# Patient Record
Sex: Female | Born: 1940 | Race: White | Hispanic: No | Marital: Married | Smoking: Never smoker
Health system: Southern US, Community
[De-identification: ages and names within clinical notes are randomized; demographics above are authoritative.]

## PROBLEM LIST (undated history)

## (undated) DIAGNOSIS — E119 Type 2 diabetes mellitus without complications: Secondary | ICD-10-CM

## (undated) DIAGNOSIS — H409 Unspecified glaucoma: Secondary | ICD-10-CM

## (undated) DIAGNOSIS — E785 Hyperlipidemia, unspecified: Secondary | ICD-10-CM

## (undated) DIAGNOSIS — I499 Cardiac arrhythmia, unspecified: Secondary | ICD-10-CM

## (undated) DIAGNOSIS — E039 Hypothyroidism, unspecified: Secondary | ICD-10-CM

## (undated) DIAGNOSIS — M1990 Unspecified osteoarthritis, unspecified site: Secondary | ICD-10-CM

## (undated) HISTORY — DX: Unspecified glaucoma: H40.9

## (undated) HISTORY — PX: HERNIA REPAIR: SHX51

## (undated) HISTORY — PX: CHOLECYSTECTOMY: SHX55

## (undated) HISTORY — PX: GALLBLADDER SURGERY: SHX652

---

## 2009-12-25 ENCOUNTER — Emergency Department: Payer: Self-pay | Admitting: Emergency Medicine

## 2010-01-04 ENCOUNTER — Ambulatory Visit: Payer: Self-pay | Admitting: General Surgery

## 2016-01-05 ENCOUNTER — Encounter: Payer: Self-pay | Admitting: *Deleted

## 2016-01-05 ENCOUNTER — Emergency Department: Payer: Medicare Other

## 2016-01-05 ENCOUNTER — Inpatient Hospital Stay
Admission: EM | Admit: 2016-01-05 | Discharge: 2016-01-07 | DRG: 310 | Disposition: A | Discharge status: Home / Self Care | Payer: Medicare Other | Attending: Internal Medicine | Admitting: Internal Medicine

## 2016-01-05 DIAGNOSIS — Z7984 Long term (current) use of oral hypoglycemic drugs: Secondary | ICD-10-CM | POA: Diagnosis not present

## 2016-01-05 DIAGNOSIS — E039 Hypothyroidism, unspecified: Secondary | ICD-10-CM | POA: Diagnosis present

## 2016-01-05 DIAGNOSIS — E119 Type 2 diabetes mellitus without complications: Secondary | ICD-10-CM | POA: Diagnosis present

## 2016-01-05 DIAGNOSIS — Z886 Allergy status to analgesic agent status: Secondary | ICD-10-CM

## 2016-01-05 DIAGNOSIS — M542 Cervicalgia: Secondary | ICD-10-CM | POA: Diagnosis present

## 2016-01-05 DIAGNOSIS — I4891 Unspecified atrial fibrillation: Principal | ICD-10-CM | POA: Diagnosis present

## 2016-01-05 DIAGNOSIS — E785 Hyperlipidemia, unspecified: Secondary | ICD-10-CM | POA: Diagnosis present

## 2016-01-05 DIAGNOSIS — Z79899 Other long term (current) drug therapy: Secondary | ICD-10-CM

## 2016-01-05 DIAGNOSIS — I10 Essential (primary) hypertension: Secondary | ICD-10-CM | POA: Diagnosis present

## 2016-01-05 HISTORY — DX: Hyperlipidemia, unspecified: E78.5

## 2016-01-05 HISTORY — DX: Hypothyroidism, unspecified: E03.9

## 2016-01-05 HISTORY — DX: Type 2 diabetes mellitus without complications: E11.9

## 2016-01-05 LAB — BASIC METABOLIC PANEL
Anion gap: 9 (ref 5–15)
BUN: 23 mg/dL — AB (ref 6–20)
CHLORIDE: 102 mmol/L (ref 101–111)
CO2: 25 mmol/L (ref 22–32)
CREATININE: 0.88 mg/dL (ref 0.44–1.00)
Calcium: 8.9 mg/dL (ref 8.9–10.3)
GFR calc Af Amer: >60 mL/min (ref >60)
GFR calc non Af Amer: >60 mL/min (ref >60)
Glucose, Bld: 295 mg/dL — ABNORMAL HIGH (ref 65–99)
Potassium: 4.3 mmol/L (ref 3.5–5.1)
SODIUM: 136 mmol/L (ref 135–145)

## 2016-01-05 LAB — CBC WITH DIFFERENTIAL/PLATELET
Basophils Absolute: 0.1 10*3/uL (ref 0–0.1)
Basophils Relative: 1 %
EOS PCT: 1 %
Eosinophils Absolute: 0.1 10*3/uL (ref 0–0.7)
HCT: 40.1 % (ref 35.0–47.0)
HEMOGLOBIN: 13.4 g/dL (ref 12.0–16.0)
LYMPHS ABS: 1.2 10*3/uL (ref 1.0–3.6)
LYMPHS PCT: 17 %
MCH: 31 pg (ref 26.0–34.0)
MCHC: 33.5 g/dL (ref 32.0–36.0)
MCV: 92.5 fL (ref 80.0–100.0)
Monocytes Absolute: 0.7 10*3/uL (ref 0.2–0.9)
Monocytes Relative: 10 %
NEUTROS PCT: 71 %
Neutro Abs: 4.9 10*3/uL (ref 1.4–6.5)
Platelets: 208 10*3/uL (ref 150–440)
RBC: 4.33 MIL/uL (ref 3.80–5.20)
RDW: 14.5 % (ref 11.5–14.5)
WBC: 7 10*3/uL (ref 3.6–11.0)

## 2016-01-05 LAB — PROTIME-INR
INR: 1.19
Prothrombin Time: 15.3 seconds — ABNORMAL HIGH (ref 11.4–15.0)

## 2016-01-05 LAB — TSH: TSH: 3.142 u[IU]/mL (ref 0.350–4.500)

## 2016-01-05 LAB — TROPONIN I: TROPONIN I: 0.03 ng/mL (ref <0.031)

## 2016-01-05 LAB — MRSA PCR SCREENING: MRSA by PCR: NEGATIVE

## 2016-01-05 LAB — GLUCOSE, CAPILLARY: Glucose-Capillary: 212 mg/dL — ABNORMAL HIGH (ref 65–99)

## 2016-01-05 LAB — BRAIN NATRIURETIC PEPTIDE: B Natriuretic Peptide: 422 pg/mL — ABNORMAL HIGH (ref 0.0–100.0)

## 2016-01-05 LAB — MAGNESIUM: Magnesium: 1.7 mg/dL (ref 1.7–2.4)

## 2016-01-05 MED ORDER — ONDANSETRON HCL 4 MG PO TABS: 4.0000 mg | ORAL_TABLET | Freq: Four times a day (QID) | ORAL | Status: DC | PRN

## 2016-01-05 MED ORDER — ENOXAPARIN SODIUM 120 MG/0.8ML ~~LOC~~ SOLN
1.0000 mg/kg | Freq: Two times a day (BID) | SUBCUTANEOUS | Status: DC
  Administered 2016-01-05 – 2016-01-06 (×3): 115 mg via SUBCUTANEOUS
  Filled 2016-01-05 (×5): qty 0.8

## 2016-01-05 MED ORDER — TIMOLOL MALEATE 0.5 % OP SOLN
1.0000 [drp] | Freq: Every day | OPHTHALMIC | Status: DC
  Administered 2016-01-06 – 2016-01-07 (×2): 1 [drp] via OPHTHALMIC
  Filled 2016-01-05: qty 5

## 2016-01-05 MED ORDER — ACETAMINOPHEN 325 MG PO TABS
650.0000 mg | ORAL_TABLET | Freq: Four times a day (QID) | ORAL | Status: DC | PRN
  Administered 2016-01-06: 650 mg via ORAL
  Filled 2016-01-05: qty 2

## 2016-01-05 MED ORDER — LEVOTHYROXINE SODIUM 50 MCG PO TABS
75.0000 ug | ORAL_TABLET | Freq: Every day | ORAL | Status: DC
  Administered 2016-01-06 – 2016-01-07 (×2): 75 ug via ORAL
  Filled 2016-01-05 (×2): qty 1

## 2016-01-05 MED ORDER — INSULIN ASPART 100 UNIT/ML ~~LOC~~ SOLN
0.0000 [IU] | Freq: Three times a day (TID) | SUBCUTANEOUS | Status: DC
Start: 2016-01-06 — End: 2016-01-07
  Administered 2016-01-06: 3 [IU] via SUBCUTANEOUS
  Administered 2016-01-06: 5 [IU] via SUBCUTANEOUS
  Administered 2016-01-06: 2 [IU] via SUBCUTANEOUS
  Administered 2016-01-07: 3 [IU] via SUBCUTANEOUS
  Administered 2016-01-07: 2 [IU] via SUBCUTANEOUS
  Filled 2016-01-05 (×2): qty 2
  Filled 2016-01-05 (×2): qty 3
  Filled 2016-01-05: qty 5

## 2016-01-05 MED ORDER — INSULIN ASPART 100 UNIT/ML ~~LOC~~ SOLN
0.0000 [IU] | Freq: Every day | SUBCUTANEOUS | Status: DC
Start: 2016-01-05 — End: 2016-01-07

## 2016-01-05 MED ORDER — SODIUM CHLORIDE 0.9 % IV SOLN
INTRAVENOUS | Status: DC
  Administered 2016-01-05: 23:00:00 via INTRAVENOUS

## 2016-01-05 MED ORDER — PIOGLITAZONE HCL 30 MG PO TABS
15.0000 mg | ORAL_TABLET | Freq: Every day | ORAL | Status: DC
  Administered 2016-01-06 – 2016-01-07 (×2): 15 mg via ORAL
  Filled 2016-01-05 (×3): qty 1

## 2016-01-05 MED ORDER — ASPIRIN 81 MG PO CHEW
324.0000 mg | CHEWABLE_TABLET | Freq: Once | ORAL | Status: AC
  Administered 2016-01-05: 324 mg via ORAL
  Filled 2016-01-05: qty 4

## 2016-01-05 MED ORDER — GLIMEPIRIDE 2 MG PO TABS
1.0000 mg | ORAL_TABLET | Freq: Every evening | ORAL | Status: DC
  Administered 2016-01-06: 1 mg via ORAL
  Filled 2016-01-05 (×2): qty 1

## 2016-01-05 MED ORDER — ACETAMINOPHEN 650 MG RE SUPP: 650.0000 mg | Freq: Four times a day (QID) | RECTAL | Status: DC | PRN

## 2016-01-05 MED ORDER — FENOFIBRATE 160 MG PO TABS
160.0000 mg | ORAL_TABLET | Freq: Every day | ORAL | Status: DC
  Administered 2016-01-05 – 2016-01-06 (×2): 160 mg via ORAL
  Filled 2016-01-05 (×3): qty 1

## 2016-01-05 MED ORDER — METFORMIN HCL 500 MG PO TABS
500.0000 mg | ORAL_TABLET | Freq: Two times a day (BID) | ORAL | Status: DC
  Administered 2016-01-06 – 2016-01-07 (×3): 500 mg via ORAL
  Filled 2016-01-05 (×3): qty 1

## 2016-01-05 MED ORDER — SODIUM CHLORIDE 0.9 % IV BOLUS (SEPSIS)
500.0000 mL | Freq: Once | INTRAVENOUS | Status: AC
  Administered 2016-01-05: 500 mL via INTRAVENOUS

## 2016-01-05 MED ORDER — DEXTROSE 5 % IV SOLN
5.0000 mg/h | Freq: Once | INTRAVENOUS | Status: AC
  Administered 2016-01-05: 5 mg/h via INTRAVENOUS
  Filled 2016-01-05: qty 100

## 2016-01-05 MED ORDER — TRIAMCINOLONE ACETONIDE 0.1 % EX CREA
1.0000 "application " | TOPICAL_CREAM | Freq: Two times a day (BID) | CUTANEOUS | Status: DC
  Administered 2016-01-05 – 2016-01-06 (×3): 1 via TOPICAL
  Filled 2016-01-05: qty 15

## 2016-01-05 MED ORDER — METOPROLOL TARTRATE 25 MG PO TABS
25.0000 mg | ORAL_TABLET | Freq: Four times a day (QID) | ORAL | Status: DC
  Administered 2016-01-05 – 2016-01-06 (×2): 25 mg via ORAL
  Filled 2016-01-05 (×2): qty 1

## 2016-01-05 MED ORDER — DILTIAZEM HCL 100 MG IV SOLR
5.0000 mg/h | INTRAVENOUS | Status: DC
  Administered 2016-01-06 (×2): 15 mg/h via INTRAVENOUS
  Administered 2016-01-06: 10 mg/h via INTRAVENOUS
  Administered 2016-01-07 (×2): 15 mg/h via INTRAVENOUS
  Administered 2016-01-07: 12.5 mg/h via INTRAVENOUS
  Filled 2016-01-05 (×5): qty 100

## 2016-01-05 MED ORDER — DILTIAZEM HCL 25 MG/5ML IV SOLN
20.0000 mg | Freq: Once | INTRAVENOUS | Status: AC
  Administered 2016-01-05: 20 mg via INTRAVENOUS
  Filled 2016-01-05: qty 5

## 2016-01-05 MED ORDER — ONDANSETRON HCL 4 MG/2ML IJ SOLN
4.0000 mg | Freq: Four times a day (QID) | INTRAMUSCULAR | Status: DC | PRN
  Administered 2016-01-07: 4 mg via INTRAVENOUS
  Filled 2016-01-05: qty 2

## 2016-01-05 MED ORDER — LATANOPROST 0.005 % OP SOLN
1.0000 [drp] | Freq: Every day | OPHTHALMIC | Status: DC
Start: 2016-01-05 — End: 2016-01-07
  Administered 2016-01-05 – 2016-01-06 (×2): 1 [drp] via OPHTHALMIC
  Filled 2016-01-05: qty 2.5

## 2016-01-05 MED ORDER — TRAMADOL HCL 50 MG PO TABS
75.0000 mg | ORAL_TABLET | Freq: Every day | ORAL | Status: DC
  Filled 2016-01-05: qty 2

## 2016-01-05 NOTE — ED Notes (Signed)
Patient transported to X-ray 

## 2016-01-05 NOTE — ED Provider Notes (Addendum)
[facility] Emergency Department Provider Note  ____________________________________________   I have reviewed the triage vital signs and the nursing notes.   HISTORY  Chief Complaint Weakness and Atrial Fibrillation    HPI Cynthia Friedman is a 75 y.o. female with a history of thyroid issues, as well as diabetes mellitus states over the last "few weeks" she has been feeling generally weak. Denies shortness of breath initially but now states she gets winded just walking up and down stairs. She has had no chest pain although she did have a little bit of pain in the right trapezius muscle now again. She cannot further describe it very well she states she cannot definitively describe it as  exertional and she is not having that now. She states that because she is feeling generally weak and she was getting too tired to even do her activities of daily living she would see her primary care doctor who noted that she was in A. fib with RVR and PCP and sent her here. She is not having any chest pain at this time.  Past Medical History  Diagnosis Date  . Diabetes mellitus without complication (HCC)   . Thyroid disease     There are no active problems to display for this patient.   Past Surgical History  Procedure Laterality Date  . Hernia repair    . Cholecystectomy      No current outpatient prescriptions on file.  Allergies Review of patient's allergies indicates no known allergies.  History reviewed. No pertinent family history.  Social History Social History  Substance Use Topics  . Smoking status: Never Smoker   . Smokeless tobacco: None  . Alcohol Use: No    Review of Systems \\Constitutional : No fever/chills Eyes: No visual changes. ENT: No sore throat. No stiff neck no neck pain Cardiovascular: Denies chest pain. Respiratory: Positive shortness of breath. Gastrointestinal:   no vomiting.  No diarrhea.  No constipation. Genitourinary: Negative  for dysuria. Musculoskeletal: Negative lower extremity swelling Skin: Negative for rash. Neurological: Negative for headaches, focal weakness or numbness. 10-point ROS otherwise negative.  ____________________________________________   PHYSICAL EXAM:  VITAL SIGNS: ED Triage Vitals  Enc Vitals Group     BP 01/05/16 1647 151/90 mmHg     Pulse Rate 01/05/16 1647 136     Resp 01/05/16 1647 20     Temp 01/05/16 1647 98.1 F (36.7 C)     Temp Source 01/05/16 1647 Oral     SpO2 01/05/16 1647 96 %     Weight 01/05/16 1647 252 lb (114.306 kg)     Height 01/05/16 1647 5' 7.5" (1.715 m)     Head Cir --      Peak Flow --      Pain Score --      Pain Loc --      Pain Edu? --      Excl. in GC? --     Constitutional: Alert and oriented. Well appearing and in no acute distress. Eyes: Conjunctivae are normal. PERRL. EOMI. Head: Atraumatic. Nose: No congestion/rhinnorhea. Mouth/Throat: Mucous membranes are moist.  Oropharynx non-erythematous. Neck: No stridor.   Nontender with no meningismus Cardiovascular: Tachycardia To 180, irregularly irregular rhythm. Grossly normal heart sounds.  Good peripheral circulation. Respiratory: Normal respiratory effort.  No retractions. Lungs CTAB. Abdominal: Soft and nontender. No distention. No guarding no rebound Back:  There is no focal tenderness or step off there is no midline tenderness there are no lesions noted. there  is no CVA tenderness Musculoskeletal: No lower extremity tenderness. No joint effusions, no DVT signs strong distal pulses no edema Neurologic:  Normal speech and language. No gross focal neurologic deficits are appreciated.  Skin:  Skin is warm, dry and intact. No rash noted. Psychiatric: Mood and affect are normal. Speech and behavior are normal.  ____________________________________________   LABS (all labs ordered are listed, but only abnormal results are displayed)  Labs Reviewed  BASIC METABOLIC PANEL  TROPONIN I   PROTIME-INR  TSH  CBC WITH DIFFERENTIAL/PLATELET  BRAIN NATRIURETIC PEPTIDE   ____________________________________________  EKG  I personally interpreted any EKGs ordered by me or triage Atrial fibrillation with rapid ventricular response rate 159 no acute ST elevation or ST depression, borderline LAD. ____________________________________________  RADIOLOGY  I reviewed any imaging ordered by me or triage that were performed during my shift and, if possible, patient and/or family made aware of any abnormal findings. ____________________________________________   PROCEDURES  Procedure(s) performed: None  Critical Care performed: CRITICAL CARE Performed by: Jeanmarie PlantJAMES A MCSHANE   Total critical care time: 40 minutes  Critical care time was exclusive of separately billable procedures and treating other patients.  Critical care was necessary to treat or prevent imminent or life-threatening deterioration.  Critical care was time spent personally by me on the following activities: development of treatment plan with patient and/or surrogate as well as nursing, discussions with consultants, evaluation of patient's response to treatment, examination of patient, obtaining history from patient or surrogate, ordering and performing treatments and interventions, ordering and review of laboratory studies, ordering and review of radiographic studies, pulse oximetry and re-evaluation of patient's condition.   ____________________________________________   INITIAL IMPRESSION / ASSESSMENT AND PLAN / ED COURSE  Pertinent labs & imaging results that were available during my care of the patient were reviewed by me and considered in my medical decision making (see chart for details).  Patient with A. fib with RVR, symptomatic, we will check her thyroid and cardiac enzymes. Obtain a chest x-ray. We will give her Cardizem and bring her heart rate down. We will further assess her as  needed.  ----------------------------------------- 7:10 PM on 01/05/2016 -----------------------------------------  On Cardizem drip her heart rate is coming down pressures are staying stable, TSH is reassuring, admitting to the hospitalist service they agree with management. ____________________________________________   FINAL CLINICAL IMPRESSION(S) / ED DIAGNOSES  Final diagnoses:  None      This chart was dictated using voice recognition software.  Despite best efforts to proofread,  errors can occur which can change meaning.     Jeanmarie PlantJames A McShane, MD 01/05/16 1723  Jeanmarie PlantJames A McShane, MD 01/05/16 1910

## 2016-01-05 NOTE — H&P (Signed)
Cynthia Friedman   PATIENT NAME: Cynthia Friedman    MR#:  [id number]  DATE OF BIRTH:  1941/03/22  DATE OF ADMISSION:  01/05/2016  PRIMARY CARE PHYSICIAN: Jaclyn Shaggy, MD   REQUESTING/REFERRING PHYSICIAN: Dr. Ileana Roup  CHIEF COMPLAINT:   Chief Complaint  Patient presents with  . Weakness  . Atrial Fibrillation    HISTORY OF PRESENT ILLNESS:  Cynthia Friedman  is a 75 y.o. female with a known history of diabetes mellitus, hypothyroidism and hyperlipidemia presents to the hospital due to palpitations and also extreme weakness and shortness of breath going on for 4 days now. Patient does not have any prior cardiac history. No prior diagnosis of arrhythmias or atrial fibrillation. She has been feeling extremely weak and also shortness of breath easily with minimal exertion for the last 2 weeks. For the last 4 days she started feeling fluttering in her heart went to see her PCP today. She was noted to be in A. fib with RVR with heart rate in 180s and was sent over to the emergency room. Heart rate is down to 120s while on Cardizem drip. Her symptoms are improving at this time.  PAST MEDICAL HISTORY:   Past Medical History  Diagnosis Date  . Diabetes mellitus without complication (HCC)   . Hypothyroidism   . Hyperlipidemia     PAST SURGICAL HISTORY:   Past Surgical History  Procedure Laterality Date  . Hernia repair    . Cholecystectomy      SOCIAL HISTORY:   Social History  Substance Use Topics  . Smoking status: Never Smoker   . Smokeless tobacco: Not on file  . Alcohol Use: No    FAMILY HISTORY:   Family History  Problem Relation Age of Onset  . Cancer Mother     Unknown cancer    DRUG ALLERGIES:   Allergies  Allergen Reactions  . Aspirin Other (See Comments)    Reaction:  Ringing in the ears    REVIEW OF SYSTEMS:   Review of Systems  Constitutional: Negative for fever, chills, weight loss and  malaise/fatigue.  HENT: Negative for ear discharge, ear pain, hearing loss, nosebleeds and tinnitus.   Eyes: Negative for blurred vision, double vision and photophobia.  Respiratory: Positive for shortness of breath. Negative for cough, hemoptysis and wheezing.   Cardiovascular: Positive for palpitations. Negative for chest pain, orthopnea and leg swelling.  Gastrointestinal: Negative for heartburn, nausea, vomiting, abdominal pain, diarrhea, constipation and melena.  Genitourinary: Negative for dysuria, urgency, frequency and hematuria.  Musculoskeletal: Negative for myalgias, back pain and neck pain.  Skin: Negative for rash.  Neurological: Positive for weakness. Negative for dizziness, tingling, tremors, sensory change, speech change, focal weakness and headaches.  Endo/Heme/Allergies: Does not bruise/bleed easily.  Psychiatric/Behavioral: Negative for depression.    MEDICATIONS AT HOME:   Prior to Admission medications   Medication Sig Start Date End Date Taking? Authorizing Provider  clobetasol ointment (TEMOVATE) 0.05 % Apply 1 application topically 2 (two) times daily.   Yes Historical Provider, MD  fenofibrate (TRICOR) 145 MG tablet Take 145 mg by mouth at bedtime.   Yes Historical Provider, MD  glimepiride (AMARYL) 1 MG tablet Take 1 mg by mouth every evening.   Yes Historical Provider, MD  latanoprost (XALATAN) 0.005 % ophthalmic solution Place 1 drop into both eyes at bedtime.   Yes Historical Provider, MD  levothyroxine (SYNTHROID, LEVOTHROID) 75 MCG tablet Take 75 mcg by mouth daily before breakfast.  Yes Historical Provider, MD  metFORMIN (GLUCOPHAGE) 500 MG tablet Take 500 mg by mouth 2 (two) times daily with a meal.   Yes Historical Provider, MD  pioglitazone (ACTOS) 15 MG tablet Take 15 mg by mouth daily.   Yes Historical Provider, MD  timolol (TIMOPTIC-XR) 0.5 % ophthalmic gel-forming Place 1 drop into both eyes daily.   Yes Historical Provider, MD  traMADol (ULTRAM) 50  MG tablet Take 75 mg by mouth at bedtime.   Yes Historical Provider, MD  triamcinolone cream (KENALOG) 0.1 % Apply 1 application topically 2 (two) times daily.   Yes Historical Provider, MD      VITAL SIGNS:  Blood pressure 133/85, pulse 123, temperature 98.1 F (36.7 C), temperature source Oral, resp. rate 13, height 5' 7.5" (1.715 m), weight 114.306 kg (252 lb), SpO2 99 %.  PHYSICAL EXAMINATION:   Physical Exam  GENERAL:  75 y.o.-year-old patient lying in the bed with no acute distress.  EYES: Pupils equal, round, reactive to light and accommodation. No scleral icterus. Extraocular muscles intact.  HEENT: Head atraumatic, normocephalic. Oropharynx and nasopharynx clear.  NECK:  Supple, no jugular venous distention. No thyroid enlargement, no tenderness.  LUNGS: Normal breath sounds bilaterally, no wheezing, rales,rhonchi or crepitation. No use of accessory muscles of respiration.  CARDIOVASCULAR: S1, S2 rapid and irregular. No murmurs, rubs, or gallops.  ABDOMEN: Soft, nontender, nondistended. Bowel sounds present. No organomegaly or mass.  EXTREMITIES: No pedal edema, cyanosis, or clubbing.  NEUROLOGIC: Cranial nerves II through XII are intact. Muscle strength 5/5 in all extremities. Sensation intact. Gait not checked.  PSYCHIATRIC: The patient is alert and oriented x 3.  SKIN: No obvious rash, lesion, or ulcer.   LABORATORY PANEL:   CBC  Recent Labs Lab 01/05/16 1650  WBC 7.0  HGB 13.4  HCT 40.1  PLT 208   ------------------------------------------------------------------------------------------------------------------  Chemistries   Recent Labs Lab 01/05/16 1650  NA 136  K 4.3  CL 102  CO2 25  GLUCOSE 295*  BUN 23*  CREATININE 0.88  CALCIUM 8.9   ------------------------------------------------------------------------------------------------------------------  Cardiac Enzymes  Recent Labs Lab 01/05/16 1650  TROPONINI 0.03    ------------------------------------------------------------------------------------------------------------------  RADIOLOGY:  Dg Chest 2 View  01/05/2016  CLINICAL DATA:  New onset of atrial fibrillation, weakness, shortness of breath EXAM: CHEST  2 VIEW COMPARISON:  None. FINDINGS: Cardiomediastinal silhouette is unremarkable. No acute infiltrate or pleural effusion. No pulmonary edema. Mild degenerative changes mid thoracic spine. Mild elevation of the right hemidiaphragm. IMPRESSION: No active cardiopulmonary disease. Mild degenerative changes mid thoracic spine. Electronically Signed   By: Natasha MeadLiviu  Pop M.D.   On: 01/05/2016 17:24    EKG:   Orders placed or performed during the hospital encounter of 01/05/16  . ED EKG within 10 minutes  . ED EKG within 10 minutes  . EKG 12-Lead  . EKG 12-Lead    IMPRESSION AND PLAN:   Collie SiadBrenda Gremillion  is a 75 y.o. female with a known history of diabetes mellitus, hypothyroidism and hyperlipidemia presents to the hospital due to palpitations and also extreme weakness and shortness of breath going on for 4 days now.  #1 New onset atrial fibrillation with rvr- normal TSH and potassium levels, check magnesium as well - Admit to telemetry. Cardiology consulted. -Echocardiogram. Recycle troponins -Started on Cardizem drip and also metoprolol added for now. Heart rate still elevated. Titrate Cardizem drip. -Started on lovenox for anticoagulation at this time.  #2 diabetes mellitus-continue Amaryl, Actos and metformin. Also on sliding scale insulin.  #  3 hypothyroidism-continue Synthroid  #4 hyperlipidemia-continue home medications  #5 DVT prophylaxis-on Lovenox.   All the records are reviewed and case discussed with ED provider. Management plans discussed with the patient, family and they are in agreement.  CODE STATUS: Full code  TOTAL TIME TAKING CARE OF THIS PATIENT: 50 minutes.    Enid Baas M.D on 01/05/2016 at 8:09 PM  Between 7am  to 6pm - Pager - [phone]  After 6pm go to [email or web address] - password [email or web address] [facility]  [facility]  Office  [phone]  CC: Primary care physician; Jaclyn Shaggy, MD

## 2016-01-05 NOTE — ED Notes (Signed)
MD Mcshane at bedside. 

## 2016-01-05 NOTE — ED Notes (Signed)
Admitting MD told patient to take her nightly diabetic medications. Patient took 500mg  of Metformin and 1mg  of glipizide.

## 2016-01-05 NOTE — ED Notes (Addendum)
Pt to ED from PCP with new onset Afib. Pt scheduled appt with PCP due to weakness and occasional SOB x past 2 weeks. Upon arrival, pt AAOx4, afib tachy 130-140's, denies SOB or chest pain. Vitals stable, NAD noted. MD Mcshane at bedside upon arrival.

## 2016-01-05 NOTE — Progress Notes (Signed)
ANTICOAGULATION CONSULT NOTE - Initial Consult  Pharmacy Consult for Lovenox  Indication: atrial fibrillation  Allergies  Allergen Reactions  . Aspirin Other (See Comments)    Reaction:  Ringing in the ears    Patient Measurements: Height: 5' 7.5" (171.5 cm) Weight: 252 lb (114.306 kg) IBW/kg (Calculated) : 62.75 Heparin Dosing Weight:   Vital Signs: Temp: 98.1 F (36.7 C) (03/08 1647) Temp Source: Oral (03/08 1647) BP: 129/85 mmHg (03/08 2045) Pulse Rate: 118 (03/08 2045)  Labs:  Recent Labs  01/05/16 1650  HGB 13.4  HCT 40.1  PLT 208  LABPROT 15.3*  INR 1.19  CREATININE 0.88  TROPONINI 0.03    Estimated Creatinine Clearance: 76.1 mL/min (by C-G formula based on Cr of 0.88).   Medical History: Past Medical History  Diagnosis Date  . Diabetes mellitus without complication (HCC)   . Hypothyroidism   . Hyperlipidemia     Medications:  Scheduled:  . enoxaparin (LOVENOX) injection  1 mg/kg Subcutaneous Q12H  . fenofibrate  160 mg Oral QHS  . glimepiride  1 mg Oral QPM  . insulin aspart  0-5 Units Subcutaneous QHS  . [START ON 01/06/2016] insulin aspart  0-9 Units Subcutaneous TID WC  . latanoprost  1 drop Both Eyes QHS  . [START ON 01/06/2016] levothyroxine  75 mcg Oral QAC breakfast  . [START ON 01/06/2016] metFORMIN  500 mg Oral BID WC  . metoprolol tartrate  25 mg Oral Q6H  . pioglitazone  15 mg Oral Daily  . timolol  1 drop Both Eyes Daily  . traMADol  75 mg Oral QHS  . triamcinolone cream  1 application Topical BID    Assessment: Pharmacy consulted to dose lovenox in this 75 year old female admitted with Afib.  CrCl = 76.1 ml/min TBW = 114.3 kg  No prior anticoag noted.   Goal of Therapy:  Thrombo-embolus prophylaxis Monitor platelets by anticoagulation protocol: Yes   Plan:  Lovenox 115 mg SQ Q12H ordered to start 3/7.   Earlin Sweeden D 01/05/2016,10:06 PM

## 2016-01-06 ENCOUNTER — Inpatient Hospital Stay
Admit: 2016-01-06 | Discharge: 2016-01-06 | Disposition: A | Discharge status: Home / Self Care | Payer: Medicare Other | Attending: Internal Medicine | Admitting: Internal Medicine

## 2016-01-06 LAB — CBC
HCT: 40.5 % (ref 35.0–47.0)
Hemoglobin: 13.7 g/dL (ref 12.0–16.0)
MCH: 31.4 pg (ref 26.0–34.0)
MCHC: 33.7 g/dL (ref 32.0–36.0)
MCV: 93.2 fL (ref 80.0–100.0)
PLATELETS: 187 10*3/uL (ref 150–440)
RBC: 4.35 MIL/uL (ref 3.80–5.20)
RDW: 14.2 % (ref 11.5–14.5)
WBC: 6.4 10*3/uL (ref 3.6–11.0)

## 2016-01-06 LAB — HEMOGLOBIN A1C: Hgb A1c MFr Bld: 7.1 % — ABNORMAL HIGH (ref 4.0–6.0)

## 2016-01-06 LAB — BASIC METABOLIC PANEL
Anion gap: 8 (ref 5–15)
BUN: 16 mg/dL (ref 6–20)
CALCIUM: 8.2 mg/dL — AB (ref 8.9–10.3)
CO2: 21 mmol/L — AB (ref 22–32)
CREATININE: 0.56 mg/dL (ref 0.44–1.00)
Chloride: 106 mmol/L (ref 101–111)
GFR calc Af Amer: >60 mL/min (ref >60)
GFR calc non Af Amer: >60 mL/min (ref >60)
GLUCOSE: 170 mg/dL — AB (ref 65–99)
Potassium: 4.3 mmol/L (ref 3.5–5.1)
Sodium: 135 mmol/L (ref 135–145)

## 2016-01-06 LAB — GLUCOSE, CAPILLARY
Glucose-Capillary: 184 mg/dL — ABNORMAL HIGH (ref 65–99)
Glucose-Capillary: 257 mg/dL — ABNORMAL HIGH (ref 65–99)
Glucose-Capillary: 249 mg/dL — ABNORMAL HIGH (ref 65–99)
Glucose-Capillary: 136 mg/dL — ABNORMAL HIGH (ref 65–99)

## 2016-01-06 LAB — TROPONIN I
TROPONIN I: 0.03 ng/mL (ref <0.031)
TROPONIN I: 0.04 ng/mL — AB (ref <0.031)
TROPONIN I: 0.04 ng/mL — AB (ref <0.031)

## 2016-01-06 MED ORDER — TRAMADOL HCL 50 MG PO TABS
50.0000 mg | ORAL_TABLET | Freq: Three times a day (TID) | ORAL | Status: DC | PRN
  Administered 2016-01-06 (×2): 50 mg via ORAL
  Filled 2016-01-06: qty 1

## 2016-01-06 MED ORDER — DILTIAZEM HCL ER COATED BEADS 240 MG PO CP24
240.0000 mg | ORAL_CAPSULE | Freq: Every day | ORAL | Status: DC
  Administered 2016-01-06 – 2016-01-07 (×2): 240 mg via ORAL
  Filled 2016-01-06 (×2): qty 1

## 2016-01-06 MED ORDER — METOPROLOL TARTRATE 50 MG PO TABS
50.0000 mg | ORAL_TABLET | Freq: Two times a day (BID) | ORAL | Status: DC
  Administered 2016-01-06 – 2016-01-07 (×3): 50 mg via ORAL
  Filled 2016-01-06 (×3): qty 1

## 2016-01-06 NOTE — Progress Notes (Signed)
Notified Dr Belia HemanKasa of patient's troponin at 0.04, no new orders.

## 2016-01-06 NOTE — Consult Note (Signed)
[facility] Cardiology Consultation Note  Patient ID: Cynthia Friedman, MRN: [id number], DOB/AGE: [birth year] 75 y.o. Admit date: 01/05/2016   Date of Consult: 01/06/2016 Primary Physician: Jaclyn Shaggy, MD Primary Cardiologist:Nikitta Sobiech  Chief Complaint:  Chief Complaint  Patient presents with  . Weakness  . Atrial Fibrillation   Reason for Consult: atrial fibrillation with rapid ventricular rate  HPI: 75 y.o. female with known diabetes without complications mixed hyperlipidemia and essential hypertension having new onset of palpitations irregular heart beat and shortness of breath. This has been waxing and waning over last week with increased frequency under intensity. The patient was seen in the emergency room with these palpitations with an EKG showing atrial fibrillation with rapid ventricular rate. The patient had no other significant issues and normal troponin. The patient now has been placed on a diltiazem drip with better heart rate control and if feeling much better. The patient has not had any heart failure or heart attack type symptoms  Past Medical History  Diagnosis Date  . Diabetes mellitus without complication (HCC)   . Hypothyroidism   . Hyperlipidemia       Surgical History:  Past Surgical History  Procedure Laterality Date  . Hernia repair    . Cholecystectomy       Home Meds: Prior to Admission medications   Medication Sig Start Date End Date Taking? Authorizing Provider  clobetasol ointment (TEMOVATE) 0.05 % Apply 1 application topically 2 (two) times daily.   Yes Historical Provider, MD  fenofibrate (TRICOR) 145 MG tablet Take 145 mg by mouth at bedtime.   Yes Historical Provider, MD  glimepiride (AMARYL) 1 MG tablet Take 1 mg by mouth every evening.   Yes Historical Provider, MD  latanoprost (XALATAN) 0.005 % ophthalmic solution Place 1 drop into both eyes at bedtime.   Yes Historical Provider, MD  levothyroxine (SYNTHROID, LEVOTHROID) 75 MCG tablet Take 75  mcg by mouth daily before breakfast.   Yes Historical Provider, MD  metFORMIN (GLUCOPHAGE) 500 MG tablet Take 500 mg by mouth 2 (two) times daily with a meal.   Yes Historical Provider, MD  pioglitazone (ACTOS) 15 MG tablet Take 15 mg by mouth daily.   Yes Historical Provider, MD  timolol (TIMOPTIC-XR) 0.5 % ophthalmic gel-forming Place 1 drop into both eyes daily.   Yes Historical Provider, MD  traMADol (ULTRAM) 50 MG tablet Take 75 mg by mouth at bedtime.   Yes Historical Provider, MD  triamcinolone cream (KENALOG) 0.1 % Apply 1 application topically 2 (two) times daily.   Yes Historical Provider, MD    Inpatient Medications:  . diltiazem  240 mg Oral Daily  . enoxaparin (LOVENOX) injection  1 mg/kg Subcutaneous Q12H  . fenofibrate  160 mg Oral QHS  . glimepiride  1 mg Oral QPM  . insulin aspart  0-5 Units Subcutaneous QHS  . insulin aspart  0-9 Units Subcutaneous TID WC  . latanoprost  1 drop Both Eyes QHS  . levothyroxine  75 mcg Oral QAC breakfast  . metFORMIN  500 mg Oral BID WC  . metoprolol tartrate  50 mg Oral BID  . pioglitazone  15 mg Oral Daily  . timolol  1 drop Both Eyes Daily  . traMADol  75 mg Oral QHS  . triamcinolone cream  1 application Topical BID   . diltiazem (CARDIZEM) infusion 15 mg/hr (01/06/16 0108)    Allergies:  Allergies  Allergen Reactions  . Aspirin Other (See Comments)    Reaction:  Ringing in the  ears    Social History   Social History  . Marital Status: Married    Spouse Name: N/A  . Number of Children: N/A  . Years of Education: N/A   Occupational History  . Not on file.   Social History Main Topics  . Smoking status: Never Smoker   . Smokeless tobacco: Not on file  . Alcohol Use: No  . Drug Use: No  . Sexual Activity: Not on file   Other Topics Concern  . Not on file   Social History Narrative   Independent, lives at home with husband.     Family History  Problem Relation Age of Onset  . Cancer Mother     Unknown cancer      Review of Systems Positive forShortness of breath Negative for: General:  chills, fever, night sweats or weight changes.  Cardiovascular: PND orthopnea syncope dizziness  Dermatological skin lesions rashes Respiratory: Cough congestion Urologic: Frequent urination urination at night and hematuria Abdominal: negative for nausea, vomiting, diarrhea, bright red blood per rectum, melena, or hematemesis Neurologic: negative for visual changes, and/or hearing changes  All other systems reviewed and are otherwise negative except as noted above.  Labs:  Recent Labs  01/05/16 1650 01/06/16 0016 01/06/16 0514  TROPONINI 0.03 0.04* 0.03   Lab Results  Component Value Date   WBC 6.4 01/06/2016   HGB 13.7 01/06/2016   HCT 40.5 01/06/2016   MCV 93.2 01/06/2016   PLT 187 01/06/2016    Recent Labs Lab 01/06/16 0514  NA 135  K 4.3  CL 106  CO2 21*  BUN 16  CREATININE 0.56  CALCIUM 8.2*  GLUCOSE 170*   No results found for: CHOL, HDL, LDLCALC, TRIG No results found for: DDIMER  Radiology/Studies:  Dg Chest 2 View  01/05/2016  CLINICAL DATA:  New onset of atrial fibrillation, weakness, shortness of breath EXAM: CHEST  2 VIEW COMPARISON:  None. FINDINGS: Cardiomediastinal silhouette is unremarkable. No acute infiltrate or pleural effusion. No pulmonary edema. Mild degenerative changes mid thoracic spine. Mild elevation of the right hemidiaphragm. IMPRESSION: No active cardiopulmonary disease. Mild degenerative changes mid thoracic spine. Electronically Signed   By: Natasha MeadLiviu  Pop M.D.   On: 01/05/2016 17:24    ZOX:WRUEAVEKG:Atrial fibrillation with rapid ventricular rate and nonspecific ST changes  Weights: Filed Weights   01/05/16 1647  Weight: 252 lb (114.306 kg)     Physical Exam: Blood pressure 137/79, pulse 114, temperature 98.5 F (36.9 C), temperature source Oral, resp. rate 18, height 5' 7.5" (1.715 m), weight 252 lb (114.306 kg), SpO2 97 %. Body mass index is 38.86  kg/(m^2). General: Well developed, well nourished, in no acute distress. Head eyes ears nose throat: Normocephalic, atraumatic, sclera non-icteric, no xanthomas, nares are without discharge. No apparent thyromegaly and/or mass  Lungs: Normal respiratory effort.  no wheezes, no rales, no rhonchi.  Heart:Irregular with normal S1 S2. no murmur gallop, no rub, PMI is normal size and placement, carotid upstroke normal without bruit, jugular venous pressure is normal Abdomen: Soft, non-tender, non-distended with normoactive bowel sounds. No hepatomegaly. No rebound/guarding. No obvious abdominal masses. Abdominal aorta is normal size without bruit Extremities: No edema. no cyanosis, no clubbing, no ulcers  Peripheral : 2+ bilateral upper extremity pulses, 2+ bilateral femoral pulses, 2+ bilateral dorsal pedal pulse Neuro: Alert and oriented. No facial asymmetry. No focal deficit. Moves all extremities spontaneously. Musculoskeletal: Normal muscle tone without kyphosis Psych:  Responds to questions appropriately with a normal affect.  Assessment: 75 year old female with diabetes with hypertension and hyperlipidemia having new onset of atrial fibrillation with rapid ventricular rate needing further treatment options  Plan: 1. Continue diltiazem drip for heart rate control 2. Increase oral medications including beta blocker and calcium channel blocker for better heart rate control 3. Echocardiogram for LV systolic dysfunction valvular heart disease causing above 4. Anticoagulation with heparin for further risk reduction in stroke with atrial fibrillation and change medication management to oral medication at a later day  Signed, Lamar Blinks M.D. [facility] Cardiology 01/06/2016, 8:46 AM

## 2016-01-06 NOTE — Progress Notes (Signed)
Patient given room assignment 241, report called to receiving RN cheryl without any questions.   Patient belonging packed to send with patient.  Patient updated.  Currently resting no apparent distress.

## 2016-01-06 NOTE — Progress Notes (Signed)
Dr Enedina FinnerSona Patel notified of patient complaint of neck pain now 6/10, received tylenol this morning for similar complaint with pain 3/10.  Per patient she thought that when her heart rate issue as acting up was when the pain had started down in the ED, also patient's last troponin was 0.4.  Dr Allena KatzPatel stated that her troponin was probably as a result of her tachycardia and that neck pain is not related to cardiac.  MD gave order for 50mg  tramadol 3 times a day PRN for pain.   MD gave order to transfer patient to 2A.

## 2016-01-06 NOTE — Progress Notes (Signed)
Rcvd call from lab reporting critical troponin of 0.04.  Notified e-link MD about result.

## 2016-01-06 NOTE — Progress Notes (Signed)
Dr Enedina FinnerSona Patel paged.

## 2016-01-06 NOTE — Progress Notes (Signed)
Patient ID: Cynthia Friedman, female   DOB: 1941-10-16, 75 y.o.   MRN: [id number] [facility] [place] [facility]   PATIENT NAME: Cynthia Friedman    MR#:  [id number]  DATE OF BIRTH:  [birth year]  SUBJECTIVE:  Came in with chest fluttering/palpitations and weakness. foud to be in afib with RVR On cardizem gtt HR 100-117  REVIEW OF SYSTEMS:   Review of Systems  Constitutional: Negative for fever, chills and weight loss.  HENT: Negative for ear discharge, ear pain and nosebleeds.   Eyes: Negative for blurred vision, pain and discharge.  Respiratory: Negative for sputum production, shortness of breath, wheezing and stridor.   Cardiovascular: Positive for palpitations. Negative for chest pain, orthopnea and PND.  Gastrointestinal: Negative for nausea, vomiting, abdominal pain and diarrhea.  Genitourinary: Negative for urgency and frequency.  Musculoskeletal: Negative for back pain and joint pain.  Neurological: Positive for weakness. Negative for sensory change, speech change and focal weakness.  Psychiatric/Behavioral: Negative for depression and hallucinations. The patient is not nervous/anxious.   All other systems reviewed and are negative.  Tolerating Diet:yes Tolerating PT: pending  DRUG ALLERGIES:   Allergies  Allergen Reactions  . Aspirin Other (See Comments)    Reaction:  Ringing in the ears    VITALS:  Blood pressure 120/80, pulse 117, temperature 97.6 F (36.4 C), temperature source Oral, resp. rate 24, height 5' 7.5" (1.715 m), weight 114.306 kg (252 lb), SpO2 96 %.  PHYSICAL EXAMINATION:   Physical Exam  GENERAL:  75 y.o.-year-old patient lying in the bed with no acute distress.  EYES: Pupils equal, round, reactive to light and accommodation. No scleral icterus. Extraocular muscles intact.  HEENT: Head atraumatic, normocephalic. Oropharynx and nasopharynx clear.  NECK:  Supple, no jugular venous distention. No thyroid enlargement, no  tenderness.  LUNGS: Normal breath sounds bilaterally, no wheezing, rales, rhonchi. No use of accessory muscles of respiration.  CARDIOVASCULAR: S1, S2 normal. No murmurs, rubs, or gallops. Tachycardia, irregularly irregular ABDOMEN: Soft, nontender, nondistended. Bowel sounds present. No organomegaly or mass.  EXTREMITIES: No cyanosis, clubbing or edema b/l.    NEUROLOGIC: Cranial nerves II through XII are intact. No focal Motor or sensory deficits b/l.   PSYCHIATRIC:  patient is alert and oriented x 3.  SKIN: No obvious rash, lesion, or ulcer.   LABORATORY PANEL:  CBC  Recent Labs Lab 01/05/16 1650  WBC 7.0  HGB 13.4  HCT 40.1  PLT 208    Chemistries   Recent Labs Lab 01/05/16 1650 01/06/16 0514  NA 136 135  K 4.3 4.3  CL 102 106  CO2 25 21*  GLUCOSE 295* 170*  BUN 23* 16  CREATININE 0.88 0.56  CALCIUM 8.9 8.2*  MG 1.7  --    Cardiac Enzymes  Recent Labs Lab 01/06/16 0514  TROPONINI 0.03   RADIOLOGY:  Dg Chest 2 View  01/05/2016  CLINICAL DATA:  New onset of atrial fibrillation, weakness, shortness of breath EXAM: CHEST  2 VIEW COMPARISON:  None. FINDINGS: Cardiomediastinal silhouette is unremarkable. No acute infiltrate or pleural effusion. No pulmonary edema. Mild degenerative changes mid thoracic spine. Mild elevation of the right hemidiaphragm. IMPRESSION: No active cardiopulmonary disease. Mild degenerative changes mid thoracic spine. Electronically Signed   By: Natasha Mead M.D.   On: 01/05/2016 17:24   ASSESSMENT AND PLAN:   Cynthia Friedman is a 75 y.o. female with a known history of diabetes mellitus, hypothyroidism and hyperlipidemia presents to the hospital due to palpitations  and also extreme weakness and shortness of breath going on for 4 days now.  #1 New onset atrial fibrillation with rvr- normal TSH and potassium levels, check magnesium as well [facility] Cardiology consulted. -Echocardiogram. -cycle troponin 0.03--0.04--0.03 -on Cardizem drip and also  metoprolol  Heart rate still elevated. Titrate Cardizem drip. -Started on lovenox for anticoagulation  #2 diabetes mellitus-continue Amaryl, Actos and metformin.  -sliding scale insulin.  #3 hypothyroidism-continue Synthroid  #4 hyperlipidemia-continue home medications  #5 DVT prophylaxis-on Lovenox.  Case discussed with Care Management/Social Worker. Management plans discussed with the patient, family and they are in agreement.  CODE STATUS: full  DVT Prophylaxis: lovenox  TOTAL critical  TIME TAKING CARE OF THIS PATIENT: 35 minutes.  >50% time spent on counselling and coordination of care  POSSIBLE D/C IN *1-2DAYS, DEPENDING ON CLINICAL CONDITION.  Note: This dictation was prepared with Dragon dictation along with smaller phrase technology. Any transcriptional errors that result from this process are unintentional.  Cynthia Friedman M.D on 01/06/2016 at 7:17 AM  Between 7am to 6pm - Pager - [phone]  After 6pm go to [email or web address] - password [email or web address] [facility]  [facility] [place] [facility]  Office  [phone]  CC: Primary care physician; Jaclyn ShaggyATE,DENNY C, MD

## 2016-01-06 NOTE — Progress Notes (Signed)
Inpatient Diabetes Program Recommendations  AACE/ADA: New Consensus Statement on Inpatient Glycemic Control (2015)  Target Ranges:  Prepandial:   less than 140 mg/dL      Peak postprandial:   less than 180 mg/dL (1-2 hours)      Critically ill patients:  140 - 180 mg/dL   Review of Glycemic Control  Results for Cynthia Friedman, Cynthia Friedman (MRN [id number]) as of 01/06/2016 09:26  Ref. Range 01/05/2016 21:31 01/06/2016 08:00  Glucose-Capillary Latest Ref Range: 65-99 mg/dL 657212 (H) 846184 (H)    Diabetes history: Type 2 Outpatient Diabetes medications: Actos 15mg /day, Metformin 500mg  bid, Amaryl 1mg /day Current orders for Inpatient glycemic control: Actos 15mg /day, Metformin 500mg  bid, Amaryl 1mg /day, Novolog 0-9 units tid, novolog 0-5 units qhs  Inpatient Diabetes Program Recommendations:  Agree with current orders for diabetes control.  If you anticipate patient may be NPO, consider stopping Amaryl and adding low dose basal insulin, Lantus 11 units qhs)   Susette RacerJulie Solae Norling, RN, [place], [place]HA, CDE Diabetes Coordinator Inpatient Diabetes Program  [phone] (Team Pager) [phone] [facility] Office) 01/06/2016 9:30 AM

## 2016-01-06 NOTE — Care Management (Signed)
Admitted for new onset atrial fib  and admitted to icu on cardizem drip.  She presented from her PCP for progressive weakness and shortness of breath.  Provide Eliquis coupon.  Patient says she does have Medicare D plan.  Patient independent in her adls.  No issues accessing medical care or transportation.  No need for home health follow up.

## 2016-01-06 NOTE — Progress Notes (Signed)
*  PRELIMINARY RESULTS* Echocardiogram 2D Echocardiogram has been performed.  Georgann HousekeeperJerry R Hege 01/06/2016, 2:25 PM

## 2016-01-07 LAB — ECHOCARDIOGRAM COMPLETE
HEIGHTINCHES: 67.5 in
Weight: 4032 oz

## 2016-01-07 LAB — GLUCOSE, CAPILLARY
GLUCOSE-CAPILLARY: 249 mg/dL — AB (ref 65–99)
Glucose-Capillary: 174 mg/dL — ABNORMAL HIGH (ref 65–99)

## 2016-01-07 MED ORDER — APIXABAN 5 MG PO TABS: 5.0000 mg | ORAL_TABLET | Freq: Two times a day (BID) | ORAL | Status: AC

## 2016-01-07 MED ORDER — APIXABAN 5 MG PO TABS
5.0000 mg | ORAL_TABLET | Freq: Two times a day (BID) | ORAL | Status: DC
  Administered 2016-01-07: 5 mg via ORAL
  Filled 2016-01-07: qty 1

## 2016-01-07 MED ORDER — DILTIAZEM HCL ER COATED BEADS 240 MG PO CP24: 240.0000 mg | ORAL_CAPSULE | Freq: Every day | ORAL | Status: AC

## 2016-01-07 MED ORDER — METOPROLOL TARTRATE 50 MG PO TABS: 50.0000 mg | ORAL_TABLET | Freq: Two times a day (BID) | ORAL | Status: AC

## 2016-01-07 NOTE — Progress Notes (Signed)
Pt. Discharged to home via wc. Discharge instructions and medication regimen reviewed at bedside with patient and daughter. Both verbalized understanding of instructions and medication regimen. Prescriptions sent to pharmacy, family aware. Patient assessment unchanged from this morning. TELE and IV discontinued per policy.

## 2016-01-07 NOTE — Progress Notes (Signed)
Patient ID: Cynthia Friedman, female   DOB: September 04, 1941, 75 y.o.   MRN: [id number] [facility] [place] [facility]   PATIENT NAME: Cynthia Friedman    MR#:  [id number]  DATE OF BIRTH:  September 04, 1941  SUBJECTIVE:  Came in with chest fluttering/palpitations and weakness. foud to be in afib with RVR On cardizem gtt HR 100-117 Right side neck pain which is better today  REVIEW OF SYSTEMS:   Review of Systems  Constitutional: Negative for fever, chills and weight loss.  HENT: Negative for ear discharge, ear pain and nosebleeds.   Eyes: Negative for blurred vision, pain and discharge.  Respiratory: Negative for sputum production, shortness of breath, wheezing and stridor.   Cardiovascular: Positive for palpitations. Negative for chest pain, orthopnea and PND.  Gastrointestinal: Negative for nausea, vomiting, abdominal pain and diarrhea.  Genitourinary: Negative for urgency and frequency.  Musculoskeletal: Negative for back pain and joint pain.  Neurological: Positive for weakness. Negative for sensory change, speech change and focal weakness.  Psychiatric/Behavioral: Negative for depression and hallucinations. The patient is not nervous/anxious.   All other systems reviewed and are negative.  Tolerating Diet:yes Tolerating PT: pending  DRUG ALLERGIES:   Allergies  Allergen Reactions  . Aspirin Other (See Comments)    Reaction:  Ringing in the ears    VITALS:  Blood pressure 133/83, pulse 115, temperature 98.3 F (36.8 C), temperature source Oral, resp. rate 22, height 5' 7.5" (1.715 m), weight 108.909 kg (240 lb 1.6 oz), SpO2 96 %.  PHYSICAL EXAMINATION:   Physical Exam  GENERAL:  75 y.o.-year-old patient lying in the bed with no acute distress.  EYES: Pupils equal, round, reactive to light and accommodation. No scleral icterus. Extraocular muscles intact.  HEENT: Head atraumatic, normocephalic. Oropharynx and nasopharynx clear.  NECK:  Supple, no  jugular venous distention. No thyroid enlargement, no tenderness.  LUNGS: Normal breath sounds bilaterally, no wheezing, rales, rhonchi. No use of accessory muscles of respiration.  CARDIOVASCULAR: S1, S2 normal. No murmurs, rubs, or gallops. Tachycardia, irregularly irregular ABDOMEN: Soft, nontender, nondistended. Bowel sounds present. No organomegaly or mass.  EXTREMITIES: No cyanosis, clubbing or edema b/l.    NEUROLOGIC: Cranial nerves II through XII are intact. No focal Motor or sensory deficits b/l.   PSYCHIATRIC:  patient is alert and oriented x 3.  SKIN: No obvious rash, lesion, or ulcer.   LABORATORY PANEL:  CBC  Recent Labs Lab 01/06/16 0514  WBC 6.4  HGB 13.7  HCT 40.5  PLT 187    Chemistries   Recent Labs Lab 01/05/16 1650 01/06/16 0514  NA 136 135  K 4.3 4.3  CL 102 106  CO2 25 21*  GLUCOSE 295* 170*  BUN 23* 16  CREATININE 0.88 0.56  CALCIUM 8.9 8.2*  MG 1.7  --    Cardiac Enzymes  Recent Labs Lab 01/06/16 0955  TROPONINI 0.04*   RADIOLOGY:  Dg Chest 2 View  01/05/2016  CLINICAL DATA:  New onset of atrial fibrillation, weakness, shortness of breath EXAM: CHEST  2 VIEW COMPARISON:  None. FINDINGS: Cardiomediastinal silhouette is unremarkable. No acute infiltrate or pleural effusion. No pulmonary edema. Mild degenerative changes mid thoracic spine. Mild elevation of the right hemidiaphragm. IMPRESSION: No active cardiopulmonary disease. Mild degenerative changes mid thoracic spine. Electronically Signed   By: Natasha MeadLiviu  Pop M.D.   On: 01/05/2016 17:24   ASSESSMENT AND PLAN:   Cynthia Friedman is a 75 y.o. female with a known history of diabetes mellitus,  hypothyroidism and hyperlipidemia presents to the hospital due to palpitations and also extreme weakness and shortness of breath going on for 4 days now.  #1 New onset atrial fibrillation with rvr- normal TSH  [facility] Cardiology consult appreciated. -Started on oral Cardizem CD 240 and metoprolol 50 twice a  day -EchocardiogramDone and results pending. -cycle troponin 0.03--0.04--0.03 -Weaned Cardizem gtt.Heart rate still elevated.  -Oral anticoagulation with eliquis  #2 diabetes mellitus-continue Amaryl, Actos and metformin.  -sliding scale insulin.  #3 hypothyroidism-continue Synthroid  #4 hyperlipidemia-continue home medications  #5 DVT prophylaxis-on Lovenox.  #6 right-sided neck pain appears musculoskeletal When necessary Ultram. If continues to have recurrent. Will get cervical spine x-rays.  Patient continues to stay stable will discharge her later home. Case discussed with Care Management/Social Worker. Management plans discussed with the patient, family and they are in agreement.  CODE STATUS: full  DVT Prophylaxis: lovenox  TOTAL critical  TIME TAKING CARE OF THIS PATIENT: 35 minutes.  >50% time spent on counselling and coordination of care [name], daughter, patient  POSSIBLE D/C IN *1-2DAYS, DEPENDING ON CLINICAL CONDITION.  Note: This dictation was prepared with Dragon dictation along with smaller phrase technology. Any transcriptional errors that result from this process are unintentional.  Gustavia Carie M.D on 01/07/2016 at 9:01 AM  Between 7am to 6pm - Pager - [phone]  After 6pm go to [email or web address] - password [email or web address] [facility]  [place] [facility]  Office  [phone]  CC: Primary care physician; Jaclyn Shaggy, MD

## 2016-01-07 NOTE — Progress Notes (Signed)
Call from central telemetry saying patient appears to be in bigeminy. Rate remains controlled in the 70s-80s. Dr. Allena KatzPatel paged, awaiting call back. Dr. Gwen PoundsKowalski also paged and updated. Per Dr. Gwen PoundsKowalski, okay to D/C drip now and monitor HR for 30-45 minutes, then can assess potential for discharge.

## 2016-01-07 NOTE — Care Management Important Message (Signed)
Important Message  Patient Details  Name: Cynthia Friedman MRN: [id number] Date of Birth: [birth year]   Medicare Important Message Given:  Yes    [name] 01/07/2016, 9:31 AM

## 2016-01-07 NOTE — Progress Notes (Signed)
Pt daughter at bedside requesting to speak with Dr. Gwen PoundsKowalski. MD on floor right now, RN will notify him of family member request.

## 2016-01-07 NOTE — Progress Notes (Signed)
Dr. Allena KatzPatel called back, RN updated her on discontinuation of gtt. MD plans to discharge patient later today.

## 2016-01-07 NOTE — Progress Notes (Signed)
ANTICOAGULATION CONSULT NOTE - Initial Consult  Pharmacy Consult for Apixaban Indication: atrial fibrillation  Allergies  Allergen Reactions  . Aspirin Other (See Comments)    Reaction:  Ringing in the ears    Patient Measurements: Height: 5' 7.5" (171.5 cm) Weight: 240 lb 1.6 oz (108.909 kg) IBW/kg (Calculated) : 62.75 Heparin Dosing Weight:  Vital Signs: BP: 133/83 mmHg (03/10 0746) Pulse Rate: 94 (03/10 0956)  Labs:  Recent Labs  01/05/16 1650 01/06/16 0016 01/06/16 0514 01/06/16 0955  HGB 13.4  --  13.7  --   HCT 40.1  --  40.5  --   PLT 208  --  187  --   LABPROT 15.3*  --   --   --   INR 1.19  --   --   --   CREATININE 0.88  --  0.56  --   TROPONINI 0.03 0.04* 0.03 0.04*    Estimated Creatinine Clearance: 81.5 mL/min (by C-G formula based on Cr of 0.56).   Medical History: Past Medical History  Diagnosis Date  . Diabetes mellitus without complication (HCC)   . Hypothyroidism   . Hyperlipidemia     Medications:  Scheduled:  . apixaban  5 mg Oral BID  . diltiazem  240 mg Oral Daily  . fenofibrate  160 mg Oral QHS  . glimepiride  1 mg Oral QPM  . insulin aspart  0-5 Units Subcutaneous QHS  . insulin aspart  0-9 Units Subcutaneous TID WC  . latanoprost  1 drop Both Eyes QHS  . levothyroxine  75 mcg Oral QAC breakfast  . metFORMIN  500 mg Oral BID WC  . metoprolol tartrate  50 mg Oral BID  . pioglitazone  15 mg Oral Daily  . timolol  1 drop Both Eyes Daily  . traMADol  75 mg Oral QHS  . triamcinolone cream  1 application Topical BID    Assessment: 75 yo female wih Afib. Wt 108.9 kg  Scr 0.56.   Currently on Lovenox treatment dose 1 mg/kg Q12hr. Last dose of Lovenox given 3/9 at 2148.     Goal of Therapy:  Monitor platelets by anticoagulation protocol: Yes   Plan:  Will begin Apixaban 5 mg po BID this am 3/10 and discontinue Lovenox.  Jereld Presti A 01/07/2016,10:14 AM

## 2016-01-07 NOTE — Discharge Summary (Signed)
[facility] [place] [facility]   PATIENT NAME: Cynthia Friedman    MR#:  [id number]  DATE OF BIRTH:  1941/07/15  DATE OF ADMISSION:  01/05/2016 ADMITTING PHYSICIAN: Enid Baas, MD  DATE OF DISCHARGE: 01/07/16  PRIMARY CARE PHYSICIAN: Jaclyn Shaggy, MD    ADMISSION DIAGNOSIS:  Atrial fibrillation with rapid ventricular response (HCC) [I48.91]  DISCHARGE DIAGNOSIS:  New onset afib with RVR  SECONDARY DIAGNOSIS:   Past Medical History  Diagnosis Date  . Diabetes mellitus without complication (HCC)   . Hypothyroidism   . Hyperlipidemia     HOSPITAL COURSE:   Cynthia Friedman is a 75 y.o. female with a known history of diabetes mellitus, hypothyroidism and hyperlipidemia presents to the hospital due to palpitations and also extreme weakness and shortness of breath going on for 4 days now.  #1 New onset atrial fibrillation with rvr- normal TSH  [facility] consult appreciated. -Started on oral Cardizem CD 240 and metoprolol 50 twice a day -EchocardiogramDone and results pending. -cycle troponin 0.03--0.04--0.03 -Weaned off Cardizem gtt. -Oral anticoagulation with eliquis  #2 diabetes mellitus-continue Amaryl, Actos and metformin.  -sliding scale insulin.  #3 hypothyroidism-continue Synthroid  #4 hyperlipidemia-continue home medications  #5 DVT prophylaxis-on Lovenox.  #6 right-sided neck pain appears musculoskeletal -prn tramadol  Overall stable d/c from cardiology standpoint CONSULTS OBTAINED:  Treatment Team:  Lamar Blinks, MD  DRUG ALLERGIES:   Allergies  Allergen Reactions  . Aspirin Other (See Comments)    Reaction:  Ringing in the ears    DISCHARGE MEDICATIONS:   Current Discharge Medication List    START taking these medications   Details  apixaban (ELIQUIS) 5 MG TABS tablet Take 1 tablet (5 mg total) by mouth 2 (two) times daily. Qty: 60 tablet, Refills: 3    diltiazem (CARDIZEM CD) 240 MG 24 hr  capsule Take 1 capsule (240 mg total) by mouth daily. Qty: 30 capsule, Refills: 2    metoprolol (LOPRESSOR) 50 MG tablet Take 1 tablet (50 mg total) by mouth 2 (two) times daily. Qty: 60 tablet, Refills: 2      CONTINUE these medications which have NOT CHANGED   Details  clobetasol ointment (TEMOVATE) 0.05 % Apply 1 application topically 2 (two) times daily.    fenofibrate (TRICOR) 145 MG tablet Take 145 mg by mouth at bedtime.    glimepiride (AMARYL) 1 MG tablet Take 1 mg by mouth every evening.    latanoprost (XALATAN) 0.005 % ophthalmic solution Place 1 drop into both eyes at bedtime.    levothyroxine (SYNTHROID, LEVOTHROID) 75 MCG tablet Take 75 mcg by mouth daily before breakfast.    metFORMIN (GLUCOPHAGE) 500 MG tablet Take 500 mg by mouth 2 (two) times daily with a meal.    pioglitazone (ACTOS) 15 MG tablet Take 15 mg by mouth daily.    timolol (TIMOPTIC-XR) 0.5 % ophthalmic gel-forming Place 1 drop into both eyes daily.    traMADol (ULTRAM) 50 MG tablet Take 75 mg by mouth at bedtime.    triamcinolone cream (KENALOG) 0.1 % Apply 1 application topically 2 (two) times daily.        If you experience worsening of your admission symptoms, develop shortness of breath, life threatening emergency, suicidal or homicidal thoughts you must seek medical attention immediately by calling 911 or calling your MD immediately  if symptoms less severe.  You Must read complete instructions/literature along with all the possible adverse reactions/side effects for all the Medicines you take and  that have been prescribed to you. Take any new Medicines after you have completely understood and accept all the possible adverse reactions/side effects.   Please note  You were cared for by a hospitalist during your hospital stay. If you have any questions about your discharge medications or the care you received while you were in the hospital after you are discharged, you can call the unit and asked  to speak with the hospitalist on call if the hospitalist that took care of you is not available. Once you are discharged, your primary care physician will handle any further medical issues. Please note that NO REFILLS for any discharge medications will be authorized once you are discharged, as it is imperative that you return to your primary care physician (or establish a relationship with a primary care physician if you do not have one) for your aftercare needs so that they can reassess your need for medications and monitor your lab values.   Chemistries   Recent Labs Lab 01/05/16 1650 01/06/16 0514  NA 136 135  K 4.3 4.3  CL 102 106  CO2 25 21*  GLUCOSE 295* 170*  BUN 23* 16  CREATININE 0.88 0.56  CALCIUM 8.9 8.2*  MG 1.7  --     Microbiology Results   Recent Results (from the past 240 hour(s))  MRSA PCR Screening     Status: None   Collection Time: 01/05/16  9:31 PM  Result Value Ref Range Status   MRSA by PCR NEGATIVE NEGATIVE Final    Comment:        The GeneXpert MRSA Assay (FDA approved for NASAL specimens only), is one component of a comprehensive MRSA colonization surveillance program. It is not intended to diagnose MRSA infection nor to guide or monitor treatment for MRSA infections.     RADIOLOGY:  Dg Chest 2 View  01/05/2016  CLINICAL DATA:  New onset of atrial fibrillation, weakness, shortness of breath EXAM: CHEST  2 VIEW COMPARISON:  None. FINDINGS: Cardiomediastinal silhouette is unremarkable. No acute infiltrate or pleural effusion. No pulmonary edema. Mild degenerative changes mid thoracic spine. Mild elevation of the right hemidiaphragm. IMPRESSION: No active cardiopulmonary disease. Mild degenerative changes mid thoracic spine. Electronically Signed   By: Natasha MeadLiviu  Pop M.D.   On: 01/05/2016 17:24     Management plans discussed with the patient, family and they are in agreement.  CODE STATUS:     Code Status Orders        Start     Ordered    01/05/16 2200  Full code   Continuous     01/05/16 2159    Code Status History    Date Active Date Inactive Code Status Order ID Comments User Context   This patient has a current code status but no historical code status.      TOTAL TIME TAKING CARE OF THIS PATIENT: 40 minutes.    Chandani Rogowski M.D on 01/07/2016 at 1:50 PM  Between 7am to 6pm - Pager - [phone] After 6pm go to [email or web address] - password [email or web address] [facility]  [facility]  Office  [phone]  CC: Primary care physician; Jaclyn ShaggyATE,DENNY C, MD

## 2016-01-07 NOTE — Care Management (Signed)
Provided Eliquis coupon.  Patient confirms she has medicare D coverage with her medicare plan

## 2016-01-07 NOTE — Progress Notes (Signed)
[facility]  Patient: Cynthia ConnorsBrenda W Fortin / Admit Date: 01/05/2016 / Date of Encounter: 01/07/2016, 7:46 AM   Subjective: Patient's heart rate is better controlled but still atrial fibrillation. Patient is he hemodynamically stable. Patient has right upper neck pain of unknown etiology  Review of Systems: Positive for: Neck pain Negative for: Vision change, hearing change, syncope, dizziness, nausea, vomiting,diarrhea, bloody stool, stomach pain, cough, congestion, diaphoresis, urinary frequency, urinary pain,skin lesions, skin rashes Others previously listed  Objective: Telemetry: Atrial fibrillation with rapid ventricular rate Physical Exam: Blood pressure 148/81, pulse 113, temperature 98.3 F (36.8 C), temperature source Oral, resp. rate 22, height 5' 7.5" (1.715 m), weight 240 lb 1.6 oz (108.909 kg), SpO2 96 %. Body mass index is 37.03 kg/(m^2). General: Well developed, well nourished, in no acute distress. Head: Normocephalic, atraumatic, sclera non-icteric, no xanthomas, nares are without discharge. Neck: No apparent masses Lungs: Normal respirations with no wheezes, no rhonchi, no rales , no crackles   Heart: Irregular rate and rhythm, normal S1 S2, no murmur, no rub, no gallop, PMI is normal size and placement, carotid upstroke normal without bruit, jugular venous pressure normal Abdomen: Soft, non-tender, non-distended with normoactive bowel sounds. No hepatosplenomegaly. Abdominal aorta is normal size without bruit Extremities: Trace edema, no clubbing, no cyanosis, no ulcers,  Peripheral: 2+ radial, 2+ femoral, 2+ dorsal pedal pulses Neuro: Alert and oriented. Moves all extremities spontaneously. Psych:  Responds to questions appropriately with a normal affect.   Intake/Output Summary (Last 24 hours) at 01/07/16 0746 Last data filed at 01/07/16 0700  Gross per 24 hour  Intake 677.06 ml  Output    800 ml  Net -122.94 ml    Inpatient  Medications:  . diltiazem  240 mg Oral Daily  . enoxaparin (LOVENOX) injection  1 mg/kg Subcutaneous Q12H  . fenofibrate  160 mg Oral QHS  . glimepiride  1 mg Oral QPM  . insulin aspart  0-5 Units Subcutaneous QHS  . insulin aspart  0-9 Units Subcutaneous TID WC  . latanoprost  1 drop Both Eyes QHS  . levothyroxine  75 mcg Oral QAC breakfast  . metFORMIN  500 mg Oral BID WC  . metoprolol tartrate  50 mg Oral BID  . pioglitazone  15 mg Oral Daily  . timolol  1 drop Both Eyes Daily  . traMADol  75 mg Oral QHS  . triamcinolone cream  1 application Topical BID   Infusions:  . diltiazem (CARDIZEM) infusion 15 mg/hr (01/07/16 0647)    Labs:  Recent Labs  01/05/16 1650 01/06/16 0514  NA 136 135  K 4.3 4.3  CL 102 106  CO2 25 21*  GLUCOSE 295* 170*  BUN 23* 16  CREATININE 0.88 0.56  CALCIUM 8.9 8.2*  MG 1.7  --    No results for input(s): AST, ALT, ALKPHOS, BILITOT, PROT, ALBUMIN in the last 72 hours.  Recent Labs  01/05/16 1650 01/06/16 0514  WBC 7.0 6.4  NEUTROABS 4.9  --   HGB 13.4 13.7  HCT 40.1 40.5  MCV 92.5 93.2  PLT 208 187    Recent Labs  01/05/16 1650 01/06/16 0016 01/06/16 0514 01/06/16 0955  TROPONINI 0.03 0.04* 0.03 0.04*   Invalid input(s): POCBNP  Recent Labs  01/06/16 0016  HGBA1C 7.1*     Weights: Filed Weights   01/05/16 1647 01/06/16 2009  Weight: 252 lb (114.306 kg) 240 lb 1.6 oz (108.909 kg)     Radiology/Studies:  Dg Chest 2  View  01/05/2016  CLINICAL DATA:  New onset of atrial fibrillation, weakness, shortness of breath EXAM: CHEST  2 VIEW COMPARISON:  None. FINDINGS: Cardiomediastinal silhouette is unremarkable. No acute infiltrate or pleural effusion. No pulmonary edema. Mild degenerative changes mid thoracic spine. Mild elevation of the right hemidiaphragm. IMPRESSION: No active cardiopulmonary disease. Mild degenerative changes mid thoracic spine. Electronically Signed   By: Natasha Mead M.D.   On: 01/05/2016 17:24      Assessment and Recommendation  75 y.o. female with essential hypertension now with new onset atrial fibrillation with rapid ventricular rate and diabetes having some neck pain but no evidence of myocardial infarction 1. Continue diltiazem and metoprolol combination for heart rate control and adjustment of dosage as necessary today 2. Continue anticoagulation and change over to oral anticoagulation with Eliquis 5 mg 2 times per day 3. Echocardiogram pending 4. Begin ambulation following for right neck pain and further adjustments of medications. Possible discharged to home with follow-up in the outpatient next week for adjustments of medication management and further treatment options  Signed, Arnoldo Hooker M.D. FACC

## 2016-01-07 NOTE — Progress Notes (Signed)
Patient ID: Fransisca ConnorsBrenda W Millikan, female   DOB: [birth year], 75 y.o.   MRN: [id number] HR in the 70-90's ECHO to be read by Dr Gwen PoundsKowalski Will d/c home and f/u cardiology as out pt

## 2016-01-07 NOTE — Progress Notes (Signed)
Per Dr. Allena KatzPatel, RN to begin weaning patient off cardizem gtt. Rate turned down to 1410ml/hr with Verdis PrimeMyrna Borun, RN. HR still afib 117. MD aware. PO cardizem and metoprolol given as ordered.

## 2016-01-07 NOTE — Plan of Care (Signed)
Problem: Activity: Goal: Ability to tolerate increased activity will improve Outcome: Completed/Met Date Met:  01/07/16 Patient ambulated around nurses station x1, approx. 265f. HR maintained in 110s. Patient did complain of shortness of breath, however RA sats remained >94% with exertion. MD aware of SOB.

## 2016-01-07 NOTE — Progress Notes (Signed)
RN asked Dr. Gwen PoundsKowalski if ECHO needed to be resulted before pt discharged today. Per MD, pt is clinically stable for discharge and ECHO results will be reviewed at f/u appt.

## 2016-02-07 ENCOUNTER — Other Ambulatory Visit: Payer: Self-pay | Admitting: Cardiology

## 2016-02-14 ENCOUNTER — Ambulatory Visit
Admission: RE | Admit: 2016-02-14 | Discharge: 2016-02-14 | Disposition: A | Discharge status: Home / Self Care | Payer: Medicare Other | Source: Ambulatory Visit | Attending: Cardiology | Admitting: Cardiology

## 2016-02-14 ENCOUNTER — Encounter
Admission: RE | Disposition: A | Discharge status: Home / Self Care | Payer: Self-pay | Source: Ambulatory Visit | Attending: Cardiology

## 2016-02-14 ENCOUNTER — Ambulatory Visit: Payer: Medicare Other | Admitting: Anesthesiology

## 2016-02-14 ENCOUNTER — Encounter: Payer: Self-pay | Admitting: *Deleted

## 2016-02-14 DIAGNOSIS — Z886 Allergy status to analgesic agent status: Secondary | ICD-10-CM | POA: Insufficient documentation

## 2016-02-14 DIAGNOSIS — H409 Unspecified glaucoma: Secondary | ICD-10-CM | POA: Insufficient documentation

## 2016-02-14 DIAGNOSIS — Z9889 Other specified postprocedural states: Secondary | ICD-10-CM | POA: Diagnosis not present

## 2016-02-14 DIAGNOSIS — Z79899 Other long term (current) drug therapy: Secondary | ICD-10-CM | POA: Diagnosis not present

## 2016-02-14 DIAGNOSIS — I4891 Unspecified atrial fibrillation: Secondary | ICD-10-CM | POA: Diagnosis not present

## 2016-02-14 DIAGNOSIS — Z9049 Acquired absence of other specified parts of digestive tract: Secondary | ICD-10-CM | POA: Diagnosis not present

## 2016-02-14 DIAGNOSIS — E785 Hyperlipidemia, unspecified: Secondary | ICD-10-CM | POA: Insufficient documentation

## 2016-02-14 DIAGNOSIS — Z7984 Long term (current) use of oral hypoglycemic drugs: Secondary | ICD-10-CM | POA: Insufficient documentation

## 2016-02-14 DIAGNOSIS — M1990 Unspecified osteoarthritis, unspecified site: Secondary | ICD-10-CM | POA: Insufficient documentation

## 2016-02-14 DIAGNOSIS — E119 Type 2 diabetes mellitus without complications: Secondary | ICD-10-CM | POA: Diagnosis not present

## 2016-02-14 DIAGNOSIS — E039 Hypothyroidism, unspecified: Secondary | ICD-10-CM | POA: Diagnosis not present

## 2016-02-14 HISTORY — PX: ELECTROPHYSIOLOGIC STUDY: SHX172A

## 2016-02-14 HISTORY — DX: Cardiac arrhythmia, unspecified: I49.9

## 2016-02-14 HISTORY — DX: Unspecified osteoarthritis, unspecified site: M19.90

## 2016-02-14 SURGERY — CARDIOVERSION (CATH LAB)
Anesthesia: General

## 2016-02-14 MED ORDER — PROPOFOL 10 MG/ML IV BOLUS
INTRAVENOUS | Status: DC | PRN
  Administered 2016-02-14: 50 mg via INTRAVENOUS

## 2016-02-14 MED ORDER — SODIUM CHLORIDE 0.9 % IV SOLN
INTRAVENOUS | Status: DC | PRN
  Administered 2016-02-14: 08:00:00 via INTRAVENOUS

## 2016-02-14 NOTE — OR Nursing (Signed)
Daughter given discharge instructions, Pt [facility] daughter [facility] apt in one week.

## 2016-02-14 NOTE — Anesthesia Preprocedure Evaluation (Signed)
Anesthesia Evaluation  Patient identified by MRN, date of birth, ID band Patient awake    Reviewed: Allergy & Precautions, H&P , NPO status , Patient's Chart, lab work & pertinent test results, reviewed documented beta blocker date and time   Airway Mallampati: II   Neck ROM: full    Dental  (+) Poor Dentition   Pulmonary neg pulmonary ROS,    Pulmonary exam normal        Cardiovascular negative cardio ROS Normal cardiovascular exam+ dysrhythmias Atrial Fibrillation      Neuro/Psych negative neurological ROS  negative psych ROS   GI/Hepatic negative GI ROS, Neg liver ROS,   Endo/Other  negative endocrine ROSdiabetesHypothyroidism   Renal/GU negative Renal ROS  negative genitourinary   Musculoskeletal   Abdominal   Peds  Hematology negative hematology ROS (+)   Anesthesia Other Findings Past Medical History:   Diabetes mellitus without complication (HCC)                 Hypothyroidism                                               Hyperlipidemia                                               Dysrhythmia                                                  Arthritis                                                  Past Surgical History:   HERNIA REPAIR                                                 CHOLECYSTECTOMY                                             BMI    Body Mass Index   39.92 kg/m 2     Reproductive/Obstetrics                             Anesthesia Physical Anesthesia Plan  ASA: IV  Anesthesia Plan: General   Post-op Pain Management:    Induction:   Airway Management Planned:   Additional Equipment:   Intra-op Plan:   Post-operative Plan:   Informed Consent: I have reviewed the patients History and Physical, chart, labs and discussed the procedure including the risks, benefits and alternatives for the proposed anesthesia with the patient or authorized representative  who has indicated his/her understanding and acceptance.   Dental Advisory Given  Plan Discussed with: CRNA  Anesthesia Plan Comments:         Anesthesia Quick Evaluation

## 2016-02-14 NOTE — Procedures (Signed)
Electrical Cardioversion Procedure Note Fransisca ConnorsBrenda W Dobosz [id number] Apr 10, 1941  Procedure: Electrical Cardioversion Indications:  Atrial Fibrillation  Procedure Details Consent: Risks of procedure as well as the alternatives and risks of each were explained to the (patient/caregiver).  Consent for procedure obtained. Time Out: Verified patient identification, verified procedure, site/side was marked, verified correct patient position, special equipment/implants available, medications/allergies/relevent history reviewed, required imaging and test results available.  Performed  Patient placed on cardiac monitor, pulse oximetry, supplemental oxygen as necessary.  Sedation given: propofol Pacer pads placed anterior and posterior chest.  Cardioverted 1 time(s).  Cardioverted at 120J.  Evaluation Findings: Post procedure EKG shows: NSR Complications: None Patient did tolerate procedure well.   Maylin Freeburg A. 02/14/2016, 7:42 AM

## 2016-02-14 NOTE — Anesthesia Postprocedure Evaluation (Signed)
Anesthesia Post Note  Patient: Cynthia Friedman  Procedure(s) Performed: Procedure(s) (LRB): Cardioversion (N/A)  Patient location during evaluation: PACU Anesthesia Type: General Level of consciousness: awake and alert Pain management: pain level controlled Vital Signs Assessment: post-procedure vital signs reviewed and stable Respiratory status: spontaneous breathing, nonlabored ventilation, respiratory function stable and patient connected to nasal cannula oxygen Cardiovascular status: blood pressure returned to baseline and stable Postop Assessment: no signs of nausea or vomiting Anesthetic complications: no    Last Vitals:  Filed Vitals:   02/14/16 0742 02/14/16 0745  BP: 123/71 111/73  Pulse: 62   Temp:    Resp: 21 18    Last Pain: There were no vitals filed for this visit.               Yevette EdwardsJames G Adams

## 2016-02-14 NOTE — Transfer of Care (Signed)
Immediate Anesthesia Transfer of Care Note  Patient: Cynthia Friedman  Procedure(s) Performed: Procedure(s): Cardioversion (N/A)  Patient Location: PACU  Anesthesia Type:General  Level of Consciousness: awake and alert   Airway & Oxygen Therapy: Patient Spontanous Breathing and Patient connected to nasal cannula oxygen  Post-op Assessment: Post -op Vital signs reviewed and stable  Post vital signs: stable  Last Vitals:  Filed Vitals:   02/14/16 0705 02/14/16 0742  BP: 120/90 123/71  Pulse: 114 62  Temp: 36.8 C   Resp: 18 21    Complications: No apparent anesthesia complications

## 2016-02-14 NOTE — Discharge Instructions (Signed)
Electrical Cardioversion, Care After °Refer to this sheet in the next few weeks. These instructions provide you with information on caring for yourself after your procedure. Your health care provider may also give you more specific instructions. Your treatment has been planned according to current medical practices, but problems sometimes occur. Call your health care provider if you have any problems or questions after your procedure. °WHAT TO EXPECT AFTER THE PROCEDURE °After your procedure, it is typical to have the following sensations: °· Some redness on the skin where the shocks were delivered. If this is tender, a sunburn lotion or hydrocortisone cream may help. °· Possible return of an abnormal heart rhythm within hours or days after the procedure. °HOME CARE INSTRUCTIONS °· Take medicines only as directed by your health care provider. Be sure you understand how and when to take your medicine. °· Learn how to feel your pulse and check it often. °· Limit your activity for 48 hours after the procedure or as directed by your health care provider. °· Avoid or minimize caffeine and other stimulants as directed by your health care provider. °SEEK MEDICAL CARE IF: °· You feel like your heart is beating too fast or your pulse is not regular. °· You have any questions about your medicines. °· You have bleeding that will not stop. °SEEK IMMEDIATE MEDICAL CARE IF: °· You are dizzy or feel faint. °· It is hard to breathe or you feel short of breath. °· There is a change in discomfort in your chest. °· Your speech is slurred or you have trouble moving an arm or leg on one side of your body. °· You get a serious muscle cramp that does not go away. °· Your fingers or toes turn cold or blue. °  °This information is not intended to replace advice given to you by your health care provider. Make sure you discuss any questions you have with your health care provider. °  °Document Released: 08/06/2013 Document Revised: 11/06/2014  Document Reviewed: 08/06/2013 °Elsevier Interactive Patient Education ©2016 [employer] ° °

## 2016-02-15 DIAGNOSIS — I4891 Unspecified atrial fibrillation: Secondary | ICD-10-CM | POA: Diagnosis not present

## 2016-02-15 LAB — GLUCOSE, CAPILLARY: Glucose-Capillary: 143 mg/dL — ABNORMAL HIGH (ref 65–99)

## 2017-12-22 IMAGING — CR DG CHEST 2V
1 series · 2 of 2 positions shown · non-contrast
Comparison: None.

CLINICAL DATA: New onset of atrial fibrillation, weakness,
shortness of breath

EXAM:
CHEST  2 VIEW

[Series 1: dg chest 2 view · 0.14mm/px · 2 of 2 slices shown]
[im 1/2]
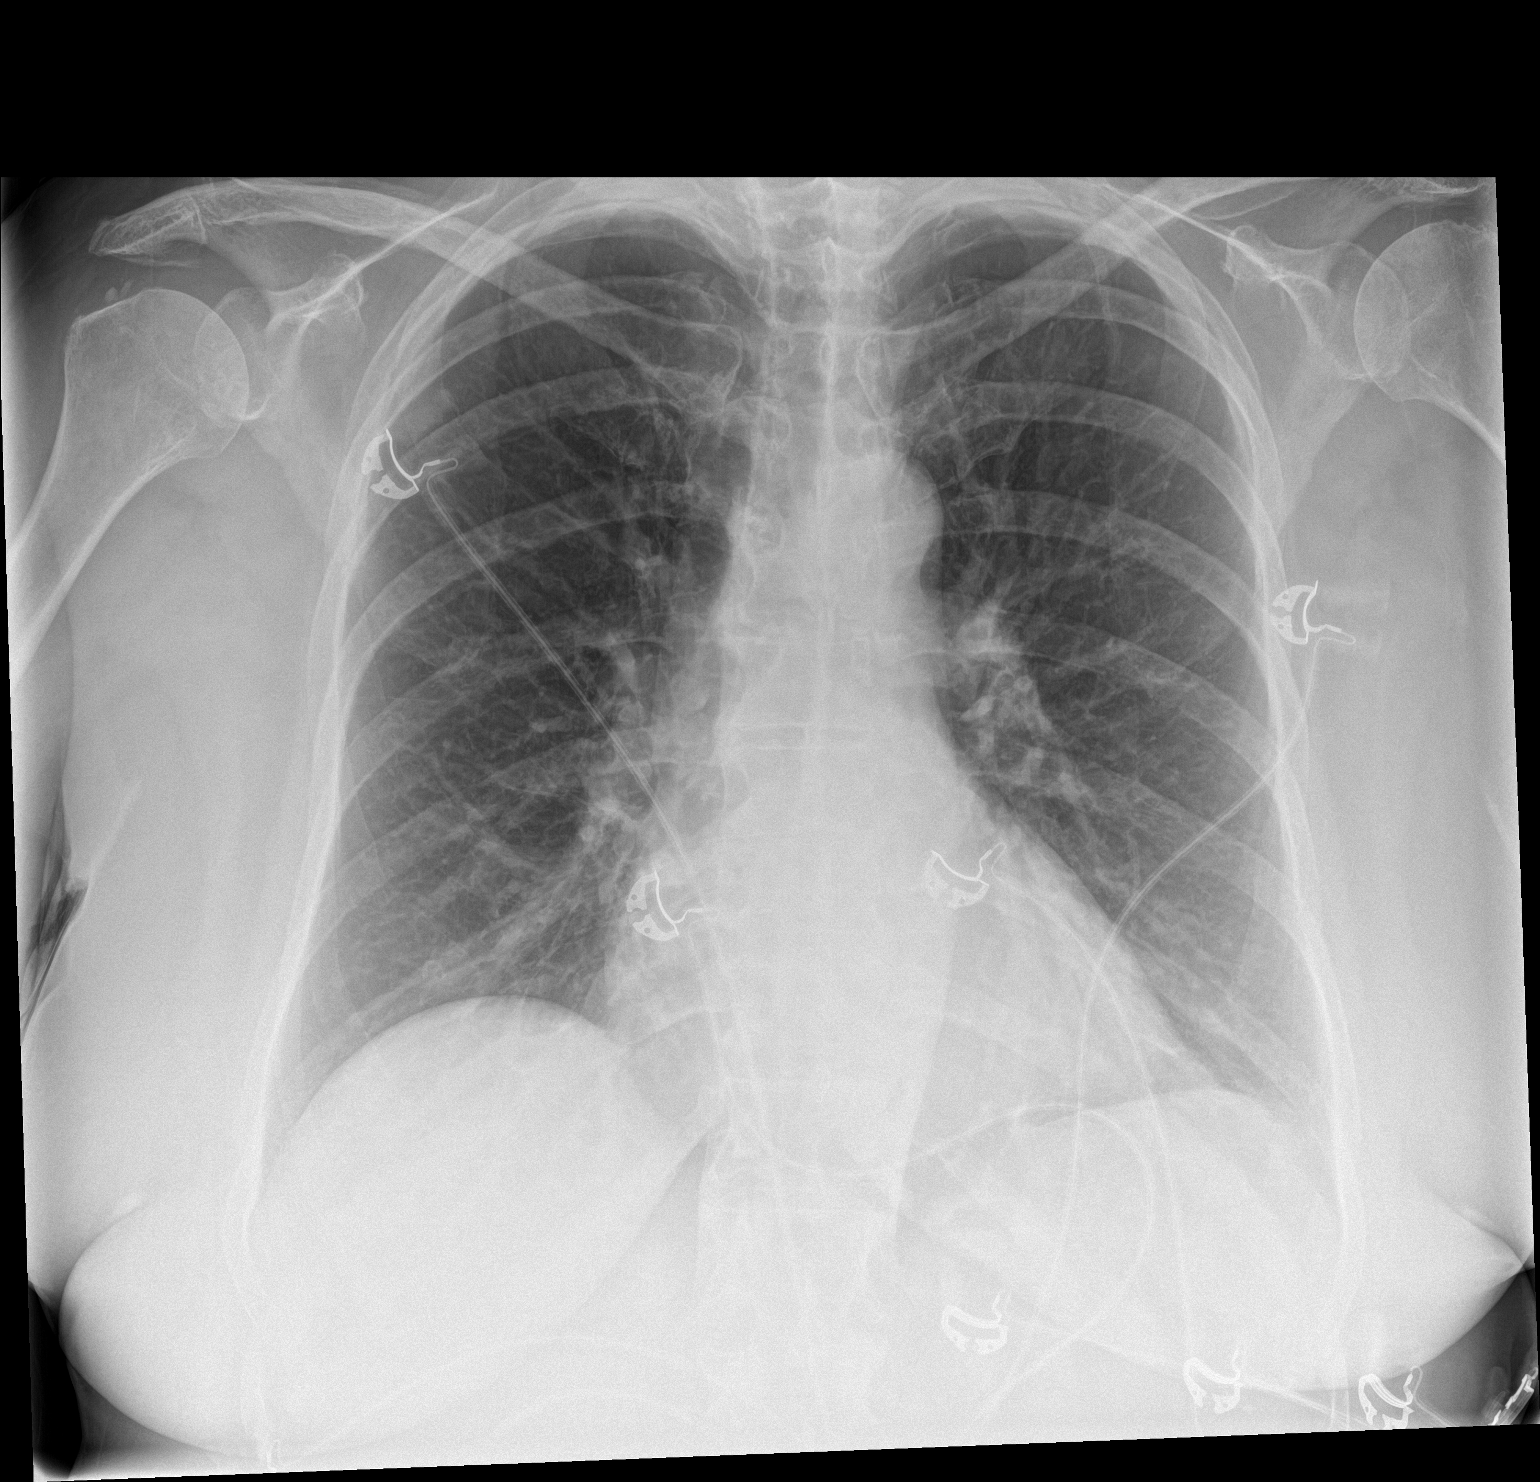
[im 2/2]
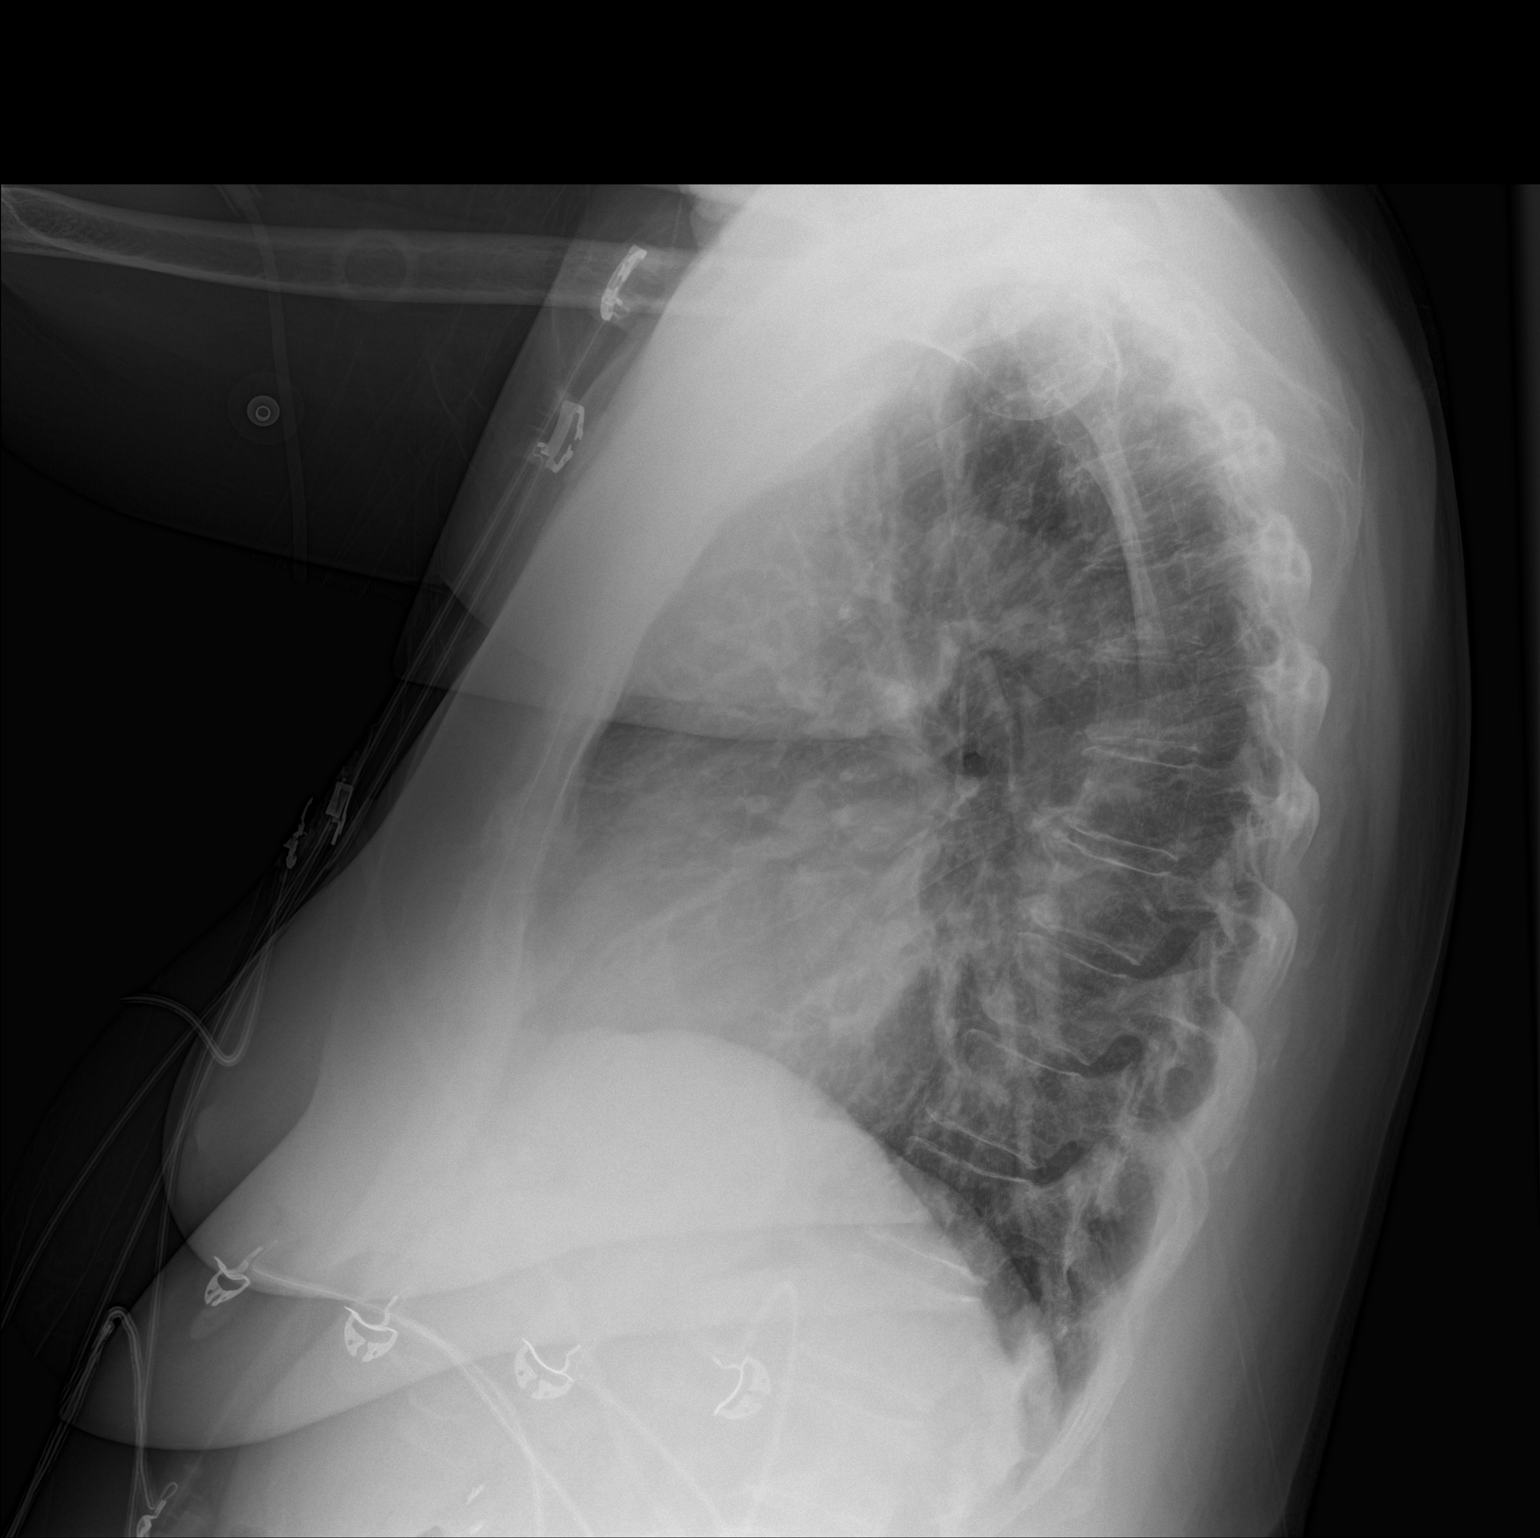

[2 of 2 positions shown; findings below may reference images not displayed]

FINDINGS: Cardiomediastinal silhouette is unremarkable. No acute infiltrate or
pleural effusion. No pulmonary edema. Mild degenerative changes mid
thoracic spine. Mild elevation of the right hemidiaphragm.
IMPRESSION: No active cardiopulmonary disease. Mild degenerative changes mid
thoracic spine.

## 2017-12-28 ENCOUNTER — Emergency Department: Payer: Medicare Other

## 2017-12-28 ENCOUNTER — Encounter: Payer: Self-pay | Admitting: Emergency Medicine

## 2017-12-28 ENCOUNTER — Inpatient Hospital Stay
Admission: EM | Admit: 2017-12-28 | Discharge: 2017-12-31 | DRG: 309 | Disposition: A | Discharge status: Home / Self Care | Payer: Medicare Other | Attending: Internal Medicine | Admitting: Internal Medicine

## 2017-12-28 DIAGNOSIS — T462X5A Adverse effect of other antidysrhythmic drugs, initial encounter: Secondary | ICD-10-CM | POA: Diagnosis present

## 2017-12-28 DIAGNOSIS — R112 Nausea with vomiting, unspecified: Secondary | ICD-10-CM

## 2017-12-28 DIAGNOSIS — I10 Essential (primary) hypertension: Secondary | ICD-10-CM | POA: Diagnosis present

## 2017-12-28 DIAGNOSIS — Z886 Allergy status to analgesic agent status: Secondary | ICD-10-CM

## 2017-12-28 DIAGNOSIS — K259 Gastric ulcer, unspecified as acute or chronic, without hemorrhage or perforation: Secondary | ICD-10-CM | POA: Diagnosis present

## 2017-12-28 DIAGNOSIS — H409 Unspecified glaucoma: Secondary | ICD-10-CM | POA: Diagnosis present

## 2017-12-28 DIAGNOSIS — Z79899 Other long term (current) drug therapy: Secondary | ICD-10-CM

## 2017-12-28 DIAGNOSIS — E039 Hypothyroidism, unspecified: Secondary | ICD-10-CM | POA: Diagnosis present

## 2017-12-28 DIAGNOSIS — N179 Acute kidney failure, unspecified: Secondary | ICD-10-CM

## 2017-12-28 DIAGNOSIS — R110 Nausea: Secondary | ICD-10-CM | POA: Diagnosis not present

## 2017-12-28 DIAGNOSIS — M1990 Unspecified osteoarthritis, unspecified site: Secondary | ICD-10-CM | POA: Diagnosis present

## 2017-12-28 DIAGNOSIS — E860 Dehydration: Secondary | ICD-10-CM | POA: Diagnosis present

## 2017-12-28 DIAGNOSIS — Z7984 Long term (current) use of oral hypoglycemic drugs: Secondary | ICD-10-CM | POA: Diagnosis not present

## 2017-12-28 DIAGNOSIS — I4891 Unspecified atrial fibrillation: Secondary | ICD-10-CM | POA: Diagnosis present

## 2017-12-28 DIAGNOSIS — E119 Type 2 diabetes mellitus without complications: Secondary | ICD-10-CM | POA: Diagnosis present

## 2017-12-28 DIAGNOSIS — I4819 Other persistent atrial fibrillation: Secondary | ICD-10-CM

## 2017-12-28 DIAGNOSIS — E785 Hyperlipidemia, unspecified: Secondary | ICD-10-CM | POA: Diagnosis present

## 2017-12-28 DIAGNOSIS — Z7901 Long term (current) use of anticoagulants: Secondary | ICD-10-CM

## 2017-12-28 DIAGNOSIS — I481 Persistent atrial fibrillation: Principal | ICD-10-CM | POA: Diagnosis present

## 2017-12-28 DIAGNOSIS — E876 Hypokalemia: Secondary | ICD-10-CM

## 2017-12-28 DIAGNOSIS — Z885 Allergy status to narcotic agent status: Secondary | ICD-10-CM

## 2017-12-28 DIAGNOSIS — K3189 Other diseases of stomach and duodenum: Secondary | ICD-10-CM | POA: Diagnosis not present

## 2017-12-28 LAB — URINALYSIS, COMPLETE (UACMP) WITH MICROSCOPIC
BILIRUBIN URINE: NEGATIVE
Glucose, UA: NEGATIVE mg/dL
HGB URINE DIPSTICK: NEGATIVE
Ketones, ur: 5 mg/dL — AB
Nitrite: NEGATIVE
Protein, ur: NEGATIVE mg/dL
Specific Gravity, Urine: 1.016 (ref 1.005–1.030)
pH: 5 (ref 5.0–8.0)

## 2017-12-28 LAB — CBC
HCT: 42.7 % (ref 35.0–47.0)
HEMOGLOBIN: 14.5 g/dL (ref 12.0–16.0)
MCH: 32.3 pg (ref 26.0–34.0)
MCHC: 33.9 g/dL (ref 32.0–36.0)
MCV: 95.4 fL (ref 80.0–100.0)
Platelets: 422 10*3/uL (ref 150–440)
RBC: 4.47 MIL/uL (ref 3.80–5.20)
RDW: 14.2 % (ref 11.5–14.5)
WBC: 8.7 10*3/uL (ref 3.6–11.0)

## 2017-12-28 LAB — COMPREHENSIVE METABOLIC PANEL
ALT: 38 U/L (ref 14–54)
ANION GAP: 16 — AB (ref 5–15)
AST: 44 U/L — ABNORMAL HIGH (ref 15–41)
Albumin: 4.6 g/dL (ref 3.5–5.0)
Alkaline Phosphatase: 36 U/L — ABNORMAL LOW (ref 38–126)
BUN: 45 mg/dL — ABNORMAL HIGH (ref 6–20)
CHLORIDE: 94 mmol/L — AB (ref 101–111)
CO2: 24 mmol/L (ref 22–32)
CREATININE: 2.28 mg/dL — AB (ref 0.44–1.00)
Calcium: 9.2 mg/dL (ref 8.9–10.3)
GFR calc non Af Amer: 20 mL/min — ABNORMAL LOW (ref >60)
GFR, EST AFRICAN AMERICAN: 23 mL/min — AB (ref >60)
Glucose, Bld: 186 mg/dL — ABNORMAL HIGH (ref 65–99)
POTASSIUM: 3.3 mmol/L — AB (ref 3.5–5.1)
SODIUM: 134 mmol/L — AB (ref 135–145)
Total Bilirubin: 1.1 mg/dL (ref 0.3–1.2)
Total Protein: 8.3 g/dL — ABNORMAL HIGH (ref 6.5–8.1)

## 2017-12-28 LAB — LIPASE, BLOOD: LIPASE: 32 U/L (ref 11–51)

## 2017-12-28 LAB — GLUCOSE, CAPILLARY: GLUCOSE-CAPILLARY: 148 mg/dL — AB (ref 65–99)

## 2017-12-28 MED ORDER — TIMOLOL MALEATE 0.5 % OP SOLG
1.0000 [drp] | Freq: Every day | OPHTHALMIC | Status: DC
  Filled 2017-12-28: qty 5

## 2017-12-28 MED ORDER — AMIODARONE HCL 200 MG PO TABS
200.0000 mg | ORAL_TABLET | Freq: Two times a day (BID) | ORAL | Status: DC
  Administered 2017-12-28 – 2017-12-29 (×2): 200 mg via ORAL
  Filled 2017-12-28 (×2): qty 1

## 2017-12-28 MED ORDER — SODIUM CHLORIDE 0.9 % IV SOLN
Freq: Once | INTRAVENOUS | Status: AC
  Administered 2017-12-28: 21:00:00 via INTRAVENOUS

## 2017-12-28 MED ORDER — DOCUSATE SODIUM 100 MG PO CAPS
100.0000 mg | ORAL_CAPSULE | Freq: Two times a day (BID) | ORAL | Status: DC
  Administered 2017-12-29 (×2): 100 mg via ORAL
  Filled 2017-12-28 (×2): qty 1

## 2017-12-28 MED ORDER — LATANOPROST 0.005 % OP SOLN
1.0000 [drp] | Freq: Every day | OPHTHALMIC | Status: DC
  Administered 2017-12-29 – 2017-12-30 (×2): 1 [drp] via OPHTHALMIC
  Filled 2017-12-28: qty 2.5

## 2017-12-28 MED ORDER — ONDANSETRON HCL 4 MG PO TABS
4.0000 mg | ORAL_TABLET | Freq: Four times a day (QID) | ORAL | Status: DC | PRN
  Filled 2017-12-28: qty 1

## 2017-12-28 MED ORDER — INSULIN ASPART 100 UNIT/ML ~~LOC~~ SOLN
0.0000 [IU] | Freq: Three times a day (TID) | SUBCUTANEOUS | Status: DC
  Administered 2017-12-29 (×3): 1 [IU] via SUBCUTANEOUS
  Administered 2017-12-30: 3 [IU] via SUBCUTANEOUS
  Administered 2017-12-30: 2 [IU] via SUBCUTANEOUS
  Filled 2017-12-28 (×5): qty 1

## 2017-12-28 MED ORDER — ACETAMINOPHEN 650 MG RE SUPP: 650.0000 mg | Freq: Four times a day (QID) | RECTAL | Status: DC | PRN

## 2017-12-28 MED ORDER — TRAMADOL HCL 50 MG PO TABS
75.0000 mg | ORAL_TABLET | Freq: Every day | ORAL | Status: DC
  Filled 2017-12-28: qty 2

## 2017-12-28 MED ORDER — ONDANSETRON HCL 4 MG/2ML IJ SOLN
4.0000 mg | Freq: Four times a day (QID) | INTRAMUSCULAR | Status: DC | PRN
  Administered 2017-12-29: 4 mg via INTRAVENOUS
  Filled 2017-12-28: qty 2

## 2017-12-28 MED ORDER — SODIUM CHLORIDE 0.9 % IV SOLN
INTRAVENOUS | Status: DC
  Administered 2017-12-28 – 2017-12-31 (×5): via INTRAVENOUS

## 2017-12-28 MED ORDER — LEVOTHYROXINE SODIUM 50 MCG PO TABS
75.0000 ug | ORAL_TABLET | Freq: Every day | ORAL | Status: DC
  Administered 2017-12-29 – 2017-12-30 (×2): 75 ug via ORAL
  Filled 2017-12-28 (×2): qty 1

## 2017-12-28 MED ORDER — INSULIN ASPART 100 UNIT/ML ~~LOC~~ SOLN
0.0000 [IU] | Freq: Every day | SUBCUTANEOUS | Status: DC
  Administered 2017-12-29: 2 [IU] via SUBCUTANEOUS
  Filled 2017-12-28: qty 1

## 2017-12-28 MED ORDER — METOPROLOL TARTRATE 50 MG PO TABS
50.0000 mg | ORAL_TABLET | Freq: Two times a day (BID) | ORAL | Status: DC
  Administered 2017-12-29 – 2017-12-30 (×3): 50 mg via ORAL
  Filled 2017-12-28 (×4): qty 1

## 2017-12-28 MED ORDER — HYDROCODONE-ACETAMINOPHEN 5-325 MG PO TABS: 1.0000 | ORAL_TABLET | ORAL | Status: DC | PRN

## 2017-12-28 MED ORDER — ACETAMINOPHEN 325 MG PO TABS: 650.0000 mg | ORAL_TABLET | Freq: Four times a day (QID) | ORAL | Status: DC | PRN

## 2017-12-28 MED ORDER — TRAZODONE HCL 50 MG PO TABS
25.0000 mg | ORAL_TABLET | Freq: Every evening | ORAL | Status: DC | PRN
  Administered 2017-12-29 – 2017-12-30 (×3): 25 mg via ORAL
  Filled 2017-12-28 (×3): qty 1

## 2017-12-28 MED ORDER — BISACODYL 5 MG PO TBEC: 5.0000 mg | DELAYED_RELEASE_TABLET | Freq: Every day | ORAL | Status: DC | PRN

## 2017-12-28 MED ORDER — APIXABAN 2.5 MG PO TABS
2.5000 mg | ORAL_TABLET | Freq: Two times a day (BID) | ORAL | Status: DC
  Administered 2017-12-29 – 2017-12-30 (×5): 2.5 mg via ORAL
  Filled 2017-12-28 (×5): qty 1

## 2017-12-28 MED ORDER — DILTIAZEM HCL ER COATED BEADS 120 MG PO CP24
240.0000 mg | ORAL_CAPSULE | Freq: Every day | ORAL | Status: DC
  Administered 2017-12-29 – 2017-12-31 (×3): 240 mg via ORAL
  Filled 2017-12-28 (×3): qty 2

## 2017-12-28 NOTE — ED Notes (Signed)
Pt to the ER for nausea and vomiting which has worsened with smells. Pt has no appetite. Pt reports dry mouth.

## 2017-12-28 NOTE — ED Provider Notes (Signed)
[facility] Emergency Department Provider Note  ____________________________________________  Time seen: Approximately 10:09 PM  I have reviewed the triage vital signs and the nursing notes.   HISTORY  Chief Complaint Emesis    HPI Cynthia Friedman is a 77 y.o. female who complains of gradual onset nausea vomiting that started about 3 weeks ago, continues to worsen. Denies any pain.  Worse with exposed to strong smells and tastes. No alleviating factors. No syncope. She is having generalized weakness. Over the past week she has been virtually unable to tolerate food and limited fluids. She saw cardiology week ago for her A. fib, was started on amiodarone load.She is on apixaban.    Past Medical History:  Diagnosis Date  . Arthritis   . Diabetes mellitus without complication (HCC)   . Dysrhythmia   . Hyperlipidemia   . Hypothyroidism      Patient Active Problem List   Diagnosis Date Noted  . A-fib (HCC) 01/05/2016     Past Surgical History:  Procedure Laterality Date  . CHOLECYSTECTOMY    . ELECTROPHYSIOLOGIC STUDY N/A 02/14/2016   Procedure: Cardioversion;  Surgeon: Dalia Heading, MD;  Location: [facility] ORS;  Service: Cardiovascular;  Laterality: N/A;  . HERNIA REPAIR       Prior to Admission medications   Medication Sig Start Date End Date Taking? Authorizing Provider  apixaban (ELIQUIS) 5 MG TABS tablet Take 1 tablet (5 mg total) by mouth 2 (two) times daily. 01/07/16   Enedina Finner, MD  clobetasol ointment (TEMOVATE) 0.05 % Apply 1 application topically 2 (two) times daily.    [provider]  diltiazem (CARDIZEM CD) 240 MG 24 hr capsule Take 1 capsule (240 mg total) by mouth daily. 01/07/16   Enedina Finner, MD  fenofibrate (TRICOR) 145 MG tablet Take 145 mg by mouth at bedtime.    [provider]  glimepiride (AMARYL) 1 MG tablet Take 1 mg by mouth every evening.    [provider]  latanoprost (XALATAN) 0.005 %  ophthalmic solution Place 1 drop into both eyes at bedtime.    [provider]  levothyroxine (SYNTHROID, LEVOTHROID) 75 MCG tablet Take 75 mcg by mouth daily before breakfast.    [provider]  metFORMIN (GLUCOPHAGE) 500 MG tablet Take 500 mg by mouth 2 (two) times daily with a meal.    [provider]  metoprolol (LOPRESSOR) 50 MG tablet Take 1 tablet (50 mg total) by mouth 2 (two) times daily. 01/07/16   Enedina Finner, MD  pioglitazone (ACTOS) 15 MG tablet Take 15 mg by mouth daily.    [provider]  timolol (TIMOPTIC-XR) 0.5 % ophthalmic gel-forming Place 1 drop into both eyes daily.    [provider]  traMADol (ULTRAM) 50 MG tablet Take 75 mg by mouth at bedtime.    [provider]  triamcinolone cream (KENALOG) 0.1 % Apply 1 application topically 2 (two) times daily.    [provider]     Allergies Aspirin and Codeine   Family History  Problem Relation Age of Onset  . Cancer Mother        Unknown cancer    Social History Social History   Tobacco Use  . Smoking status: Never Smoker  . Smokeless tobacco: Never Used  Substance Use Topics  . Alcohol use: No    Alcohol/week: 0.0 oz  . Drug use: No    Review of Systems  Constitutional:   No fever or chills.  ENT:  No sore throat. No rhinorrhea. Cardiovascular:   No chest pain or syncope. Respiratory:   No dyspnea or cough. Gastrointestinal:   Negative for abdominal pain, positive diarrhea. No constipation  Musculoskeletal:   Negative for focal pain or swelling All other systems reviewed and are negative except as documented above in ROS and HPI.  ____________________________________________   PHYSICAL EXAM:  VITAL SIGNS: ED Triage Vitals  Enc Vitals Group     BP 12/28/17 2040 (!) 153/99     Pulse Rate 12/28/17 2040 (!) 123     Resp 12/28/17 2040 20     Temp 12/28/17 2040 97.8 F (36.6 C)     Temp Source 12/28/17 2040 Oral     SpO2 12/28/17 2040  100 %     Weight 12/28/17 2041 255 lb (115.7 kg)     Height 12/28/17 2041 5\' 7"  (1.702 m)     Head Circumference --      Peak Flow --      Pain Score --      Pain Loc --      Pain Edu? --      Excl. in GC? --     Vital signs reviewed, nursing assessments reviewed.   Constitutional:   Alert and oriented. Not in distress Eyes:   No scleral icterus.  EOMI. No nystagmus. No conjunctival pallor. PERRL. ENT   Head:   Normocephalic and atraumatic.   Nose:   No congestion/rhinnorhea.    Mouth/Throat:   Dry mucous membranes, no pharyngeal erythema. No peritonsillar mass.    Neck:   No meningismus. Full ROM. Hematological/Lymphatic/Immunilogical:   No cervical lymphadenopathy. Cardiovascular:   Tachycardia rate 120. Symmetric bilateral radial and DP pulses.  No murmurs.  Respiratory:   Normal respiratory effort without tachypnea/retractions. Breath sounds are clear and equal bilaterally. No wheezes/rales/rhonchi. Gastrointestinal:   Soft and nontender. Non distended. There is no CVA tenderness.  No rebound, rigidity, or guarding. Genitourinary:   deferred Musculoskeletal:   Normal range of motion in all extremities. No joint effusions.  No lower extremity tenderness.  No edema. Neurologic:   Normal speech and language.  Motor grossly intact. No acute focal neurologic deficits are appreciated.  Skin:    Skin is warm, dry and intact. No rash noted.  No petechiae, purpura, or bullae.  ____________________________________________    LABS (pertinent positives/negatives) (all labs ordered are listed, but only abnormal results are displayed) Labs Reviewed  COMPREHENSIVE METABOLIC PANEL - Abnormal; Notable for the following components:      Result Value   Sodium 134 (*)    Potassium 3.3 (*)    Chloride 94 (*)    Glucose, Bld 186 (*)    BUN 45 (*)    Creatinine, Ser 2.28 (*)    Total Protein 8.3 (*)    AST 44 (*)    Alkaline Phosphatase 36 (*)    GFR calc non Af Amer 20 (*)     GFR calc Af Amer 23 (*)    Anion gap 16 (*)    All other components within normal limits  URINALYSIS, COMPLETE (UACMP) WITH MICROSCOPIC - Abnormal; Notable for the following components:   Color, Urine YELLOW (*)    APPearance CLOUDY (*)    Ketones, ur 5 (*)    Leukocytes, UA SMALL (*)    Bacteria, UA RARE (*)    Squamous Epithelial / LPF 6-30 (*)    All other components within normal limits  LIPASE, BLOOD  CBC   ____________________________________________  EKG  Interpreted by me Narrow complex tachycardia, irregular in appearance, sinus tachycardia versus flutter versus atrial fibrillation with rapid ventricular response. Left axis deviation. Normal QRS and ST segments. Isolated T-wave inversion in aVL which is nonspecific  ____________________________________________    RADIOLOGY  Ct Abdomen Pelvis Wo Contrast  Result Date: 12/28/2017 CLINICAL DATA:  Nausea and vomiting.  Acute renal insufficiency. EXAM: CT ABDOMEN AND PELVIS WITHOUT CONTRAST TECHNIQUE: Multidetector CT imaging of the abdomen and pelvis was performed following the standard protocol without IV contrast. COMPARISON:  None. FINDINGS: Lower chest: Minimal bibasilar atelectasis. No consolidation. No pleural effusion. Hepatobiliary: No focal liver abnormality is seen. Status post cholecystectomy. No biliary dilatation. Pancreas: Parenchymal atrophy. No ductal dilatation or inflammation. Spleen: Normal in size without focal abnormality. Adrenals/Urinary Tract: No adrenal nodule. No hydronephrosis or perinephric edema. No calcified urolithiasis. Both ureters are decompressed. Urinary bladder is nondistended. Stomach/Bowel: Lack of enteric contrast limits evaluation. The stomach is nondistended. Small periampullary duodenal diverticulum without inflammation. Small bowel is nondistended without wall thickening or inflammatory change. Appendix tentatively identified and normal. Small colonic stool burden without colonic wall  thickening or inflammatory change. No bowel obstruction. Vascular/Lymphatic: Mild aortic atherosclerosis. No aneurysm. No enlarged abdominal or pelvic lymph nodes. Reproductive: Uterus is quiescent, normal for age.  No adnexal mass. Other: Prior ventral abdominal wall hernia repair without hernia recurrence. No ascites or free air. No intra-abdominal abscess. Musculoskeletal: Prominent Schmorl's node versus superior endplate compression fracture of L5 appears chronic. Degenerative disc disease at L4-L5 and L5-S1. Facet arthropathy in the lower lumbar spine. There are no acute or suspicious osseous abnormalities. IMPRESSION: 1. No acute abnormality or explanation for vomiting. 2. No renal stones or hydronephrosis. 3. Mild Aortic Atherosclerosis (ICD10-I70.0). Electronically Signed   By: Rubye OaksMelanie  Ehinger M.D.   On: 12/28/2017 21:45    ____________________________________________   PROCEDURES Procedures  ____________________________________________    CLINICAL IMPRESSION / ASSESSMENT AND PLAN / ED COURSE  Pertinent labs & imaging results that were available during my care of the patient were reviewed by me and considered in my medical decision making (see chart for details).   Patient presents with nausea and vomiting and worsening dehydration. Check labs for electrolyte abnormalities and renal function. CT abdomen and pelvis due to persistent nausea and vomiting that is worsening despite the absence of pain, out of concern for bowel obstruction. Being elderly, obese, and diabetic, she is also at risk for occult process such as colitis appendicitis or bowel perforation that presents asymptomatically. I doubt ACS PE dissection or AAA.  Clinical Course as of Dec 29 2207  Fri Dec 28, 2017  2207 Labs reveal acute renal insufficiency and metabolic acidosis. Persistent A. fib, not improving with IV fluid so far in the ED. With concern for her dehydration, I'm not able to give her any rate limiting  agents at this time as it may cause hypotension. CT abdomen and pelvis unremarkable and does not show any signs of bowel obstruction or other acute process. Case discussed with the hospitalist for further management  [PS]    Clinical Course User Index [PS] Sharman CheekStafford, Tc Kapusta, MD     ____________________________________________   FINAL CLINICAL IMPRESSION(S) / ED DIAGNOSES    Final diagnoses:  Persistent atrial fibrillation (HCC)  Atrial fibrillation with rapid ventricular response (HCC)  Hypokalemia  Acute renal failure, unspecified acute renal failure type [facility](HCC)     ED Discharge Orders    None      Portions of this note were generated with  Scientist, clinical (histocompatibility and immunogenetics). Dictation errors may occur despite best attempts at proofreading.    Sharman Cheek, MD 12/28/17 2213

## 2017-12-28 NOTE — ED Triage Notes (Signed)
Pt comes into the ED via POV c/o emesis for a couple of weeks and it is progressing.  Patient denies any abdominal pain or diarrhea.  Patient has been taking Zantac with no relief.  Patient in NAD at this time with even and unlabored respirations, but the patient feels dehydrated at this time. Patient has been staying on a BRAT diet for the past couple of weeks as well to help.

## 2017-12-28 NOTE — H&P (Signed)
[facility] [place] [facility]   PATIENT NAME: Cynthia Friedman    MR#:  [id number]  DATE OF BIRTH:  1940-12-05  DATE OF ADMISSION:  12/28/2017  PRIMARY CARE PHYSICIAN: Jaclyn Shaggy, MD   REQUESTING/REFERRING PHYSICIAN:   CHIEF COMPLAINT:   Chief Complaint  Patient presents with  . Emesis    HISTORY OF PRESENT ILLNESS: Cynthia Friedman  is a 77 y.o. female with a known history of diabetes type 2, hyperlipidemia, and atrial fibrillation, status post cardioversion, on anticoagulation. Patient presented to emergency room for nausea, vomiting and generalized weakness started, approximately 3 weeks ago and becoming gradually more severe in the past week.  No fever or chills, no abdominal pain, no diarrhea, although patient complains of loose stool going on for for a year or so. She was recently started on amiodarone by cardiology, just 1 week ago, due to recurrent Afib with RVR.  At the arrival to emergency room patient was noted with atrial fibrillation with RVR, HR in 120s.  Blood test results are remarkable for low potassium level at 3.3 and elevated creatinine level at 2.28.  BUN is 45. CT of the abdomen, reviewed by myself, is unremarkable. Patient is admitted for further evaluation and treatment.   PAST MEDICAL HISTORY:   Past Medical History:  Diagnosis Date  . Arthritis   . Diabetes mellitus without complication (HCC)   . Dysrhythmia   . Hyperlipidemia   . Hypothyroidism     PAST SURGICAL HISTORY:  Past Surgical History:  Procedure Laterality Date  . CHOLECYSTECTOMY    . ELECTROPHYSIOLOGIC STUDY N/A 02/14/2016   Procedure: Cardioversion;  Surgeon: Dalia Heading, MD;  Location: [facility] ORS;  Service: Cardiovascular;  Laterality: N/A;  . HERNIA REPAIR      SOCIAL HISTORY:  Social History   Tobacco Use  . Smoking status: Never Smoker  . Smokeless tobacco: Never Used  Substance Use Topics  . Alcohol use: No    Alcohol/week: 0.0 oz     FAMILY HISTORY:  Family History  Problem Relation Age of Onset  . Cancer Mother        Unknown cancer    DRUG ALLERGIES:  Allergies  Allergen Reactions  . Aspirin Other (See Comments)    Reaction:  Ringing in the ears  . Codeine Nausea Only    REVIEW OF SYSTEMS:   CONSTITUTIONAL: No fever, but patient complains of fatigue and generalized weakness.  EYES: No blurred or double vision.  EARS, NOSE, AND THROAT: No tinnitus or ear pain.  RESPIRATORY: No cough, shortness of breath, wheezing or hemoptysis.  CARDIOVASCULAR: No chest pain, orthopnea, edema.  GASTROINTESTINAL: Positive for nausea, vomiting; no diarrhea or abdominal pain.  GENITOURINARY: No dysuria, hematuria.  ENDOCRINE: No polyuria, nocturia,  HEMATOLOGY: No bleeding SKIN: No rash or lesion. MUSCULOSKELETAL: Positive history of osteoarthritis.   NEUROLOGIC: No focal weakness.  PSYCHIATRY: No anxiety or depression.   MEDICATIONS AT HOME:  Prior to Admission medications   Medication Sig Start Date End Date Taking? Authorizing Provider  amiodarone (PACERONE) 200 MG tablet Take 1 tablet by mouth 2 (two) times daily. TAKE 2 TABLET BY MOUTH TWICE A DAY FOR 1 WEEK THEN 1 TABLET TWICE A DAY FOR 3 WEEKS THEN 1 TABLET DAILY 12/21/17   [provider]  apixaban (ELIQUIS) 5 MG TABS tablet Take 1 tablet (5 mg total) by mouth 2 (two) times daily. 01/07/16   Enedina Finner, MD  clobetasol ointment (TEMOVATE) 0.05 % Apply 1  application topically 2 (two) times daily.    [provider]  diltiazem (CARDIZEM CD) 240 MG 24 hr capsule Take 1 capsule (240 mg total) by mouth daily. 01/07/16   Enedina Finner, MD  fenofibrate (TRICOR) 145 MG tablet Take 145 mg by mouth at bedtime.    [provider]  furosemide (LASIX) 20 MG tablet Take 20 mg by mouth daily. 12/01/17   [provider]  glimepiride (AMARYL) 1 MG tablet Take 1 mg by mouth every evening.    [provider]  hydrochlorothiazide  (HYDRODIURIL) 25 MG tablet Take 25 mg by mouth daily. 12/10/17   [provider]  latanoprost (XALATAN) 0.005 % ophthalmic solution Place 1 drop into both eyes at bedtime.    [provider]  levothyroxine (SYNTHROID, LEVOTHROID) 75 MCG tablet Take 75 mcg by mouth daily before breakfast.    [provider]  metFORMIN (GLUCOPHAGE) 500 MG tablet Take 500 mg by mouth 2 (two) times daily with a meal.    [provider]  metoprolol (LOPRESSOR) 50 MG tablet Take 1 tablet (50 mg total) by mouth 2 (two) times daily. 01/07/16   Enedina Finner, MD  pioglitazone (ACTOS) 15 MG tablet Take 15 mg by mouth daily.    [provider]  timolol (TIMOPTIC-XR) 0.5 % ophthalmic gel-forming Place 1 drop into both eyes daily.    [provider]  traMADol (ULTRAM) 50 MG tablet Take 75 mg by mouth at bedtime.    [provider]  triamcinolone cream (KENALOG) 0.1 % Apply 1 application topically 2 (two) times daily.    [provider]      PHYSICAL EXAMINATION:   VITAL SIGNS: Blood pressure (!) 152/95, pulse (!) 120, temperature 97.8 F (36.6 C), temperature source Oral, resp. rate 13, height 5\' 7"  (1.702 m), weight 115.7 kg (255 lb), SpO2 98 %.  GENERAL:  77 y.o.-year-old patient lying in the bed, in mild distress, secondary to nausea.  EYES: Pupils equal, round, reactive to light and accommodation. No scleral icterus. Extraocular muscles intact.  HEENT: Head atraumatic, normocephalic. Oropharynx and nasopharynx clear.  NECK:  Supple, no jugular venous distention. No thyroid enlargement, no tenderness.  LUNGS: Normal breath sounds bilaterally, no wheezing, rales,rhonchi or crepitation. No use of accessory muscles of respiration.  CARDIOVASCULAR: S1, S2 normal. No S3/S4.  ABDOMEN: Soft, nontender, nondistended. Bowel sounds present. No organomegaly or mass.  EXTREMITIES: No pedal edema, cyanosis, or clubbing.  NEUROLOGIC: No focal weakness.  Gait not  checked, due to patient's generalized weakness.  PSYCHIATRIC: The patient is alert and oriented x 3.  SKIN: No obvious rash, lesion, or ulcer.   LABORATORY PANEL:   CBC Recent Labs  Lab 12/28/17 2040  WBC 8.7  HGB 14.5  HCT 42.7  PLT 422  MCV 95.4  MCH 32.3  MCHC 33.9  RDW 14.2   ------------------------------------------------------------------------------------------------------------------  Chemistries  Recent Labs  Lab 12/28/17 2040  NA 134*  K 3.3*  CL 94*  CO2 24  GLUCOSE 186*  BUN 45*  CREATININE 2.28*  CALCIUM 9.2  AST 44*  ALT 38  ALKPHOS 36*  BILITOT 1.1   ------------------------------------------------------------------------------------------------------------------ estimated creatinine clearance is 28.4 mL/min (A) (by C-G formula based on SCr of 2.28 mg/dL (H)). ------------------------------------------------------------------------------------------------------------------ No results for input(s): TSH, T4TOTAL, T3FREE, THYROIDAB in the last 72 hours.  Invalid input(s): FREET3   Coagulation profile No results for input(s): INR, PROTIME in the last 168 hours. ------------------------------------------------------------------------------------------------------------------- No results for input(s): DDIMER in the last 72  hours. -------------------------------------------------------------------------------------------------------------------  Cardiac Enzymes No results for input(s): CKMB, TROPONINI, MYOGLOBIN in the last 168 hours.  Invalid input(s): CK ------------------------------------------------------------------------------------------------------------------ Invalid input(s): POCBNP  ---------------------------------------------------------------------------------------------------------------  Urinalysis    Component Value Date/Time   COLORURINE YELLOW (A) 12/28/2017 2040   APPEARANCEUR CLOUDY (A) 12/28/2017 2040   LABSPEC 1.016  12/28/2017 2040   PHURINE 5.0 12/28/2017 2040   GLUCOSEU NEGATIVE 12/28/2017 2040   HGBUR NEGATIVE 12/28/2017 2040   BILIRUBINUR NEGATIVE 12/28/2017 2040   KETONESUR 5 (A) 12/28/2017 2040   PROTEINUR NEGATIVE 12/28/2017 2040   NITRITE NEGATIVE 12/28/2017 2040   LEUKOCYTESUR SMALL (A) 12/28/2017 2040     RADIOLOGY: Ct Abdomen Pelvis Wo Contrast  Result Date: 12/28/2017 CLINICAL DATA:  Nausea and vomiting.  Acute renal insufficiency. EXAM: CT ABDOMEN AND PELVIS WITHOUT CONTRAST TECHNIQUE: Multidetector CT imaging of the abdomen and pelvis was performed following the standard protocol without IV contrast. COMPARISON:  None. FINDINGS: Lower chest: Minimal bibasilar atelectasis. No consolidation. No pleural effusion. Hepatobiliary: No focal liver abnormality is seen. Status post cholecystectomy. No biliary dilatation. Pancreas: Parenchymal atrophy. No ductal dilatation or inflammation. Spleen: Normal in size without focal abnormality. Adrenals/Urinary Tract: No adrenal nodule. No hydronephrosis or perinephric edema. No calcified urolithiasis. Both ureters are decompressed. Urinary bladder is nondistended. Stomach/Bowel: Lack of enteric contrast limits evaluation. The stomach is nondistended. Small periampullary duodenal diverticulum without inflammation. Small bowel is nondistended without wall thickening or inflammatory change. Appendix tentatively identified and normal. Small colonic stool burden without colonic wall thickening or inflammatory change. No bowel obstruction. Vascular/Lymphatic: Mild aortic atherosclerosis. No aneurysm. No enlarged abdominal or pelvic lymph nodes. Reproductive: Uterus is quiescent, normal for age.  No adnexal mass. Other: Prior ventral abdominal wall hernia repair without hernia recurrence. No ascites or free air. No intra-abdominal abscess. Musculoskeletal: Prominent Schmorl's node versus superior endplate compression fracture of L5 appears chronic. Degenerative disc  disease at L4-L5 and L5-S1. Facet arthropathy in the lower lumbar spine. There are no acute or suspicious osseous abnormalities. IMPRESSION: 1. No acute abnormality or explanation for vomiting. 2. No renal stones or hydronephrosis. 3. Mild Aortic Atherosclerosis (ICD10-I70.0). Electronically Signed   By: Rubye OaksMelanie  Ehinger M.D.   On: 12/28/2017 21:45    EKG: Orders placed or performed during the hospital encounter of 12/28/17  . ED EKG  . ED EKG  . EKG 12-Lead  . EKG 12-Lead    IMPRESSION AND PLAN:  1.  A. fib with RVR.  HR is at 120.  Likely, precipitated by dehydration.  We will continue amiodarone and metoprolol for now.  Also, start IV fluids.  Continue to monitor patient on telemetry.  Cardiology is consulted for further evaluation and management. 2.  Acute renal failure creatinine level is elevated at 2.28.  Patient's baseline is within normal limits.  This is likely prerenal, secondary to dehydration.  We will start IV fluids and monitor kidney function closely.  Avoid nephrotoxic medications. 3.  Nausea/vomiting, likely multifactorial, secondary to amiodarone side effects and uremia.  Will treat with Zofran as needed.  Continue to monitor clinically closely.  If no improvement, will consult gastroenterology for further evaluation and management.  Patient never had EGD or colonoscopy done. 4.  Hypertension, stable.  We will restart home medications, except for hydrochlorothiazide, which will hold due to dehydration.  All the records are reviewed and case discussed with ED provider. Management plans discussed with the patient, family and they are in agreement.  CODE STATUS: Code Status History    Date Active Date  Inactive Code Status Order ID Comments User Context   01/05/2016 21:59 01/07/2016 20:02 Full Code [id number]  Enid Baas, MD Inpatient       TOTAL TIME TAKING CARE OF THIS PATIENT: 35 minutes.    Cammy Copa M.D on 12/28/2017 at 10:54 PM  Between 7am to 6pm - Pager -  [phone]  After 6pm go to [email or web address] - password [email or web address] [facility]  [facility]  Office  [phone]  CC: Primary care physician; Jaclyn Shaggy, MD

## 2017-12-29 ENCOUNTER — Other Ambulatory Visit: Payer: Self-pay

## 2017-12-29 LAB — GLUCOSE, CAPILLARY
GLUCOSE-CAPILLARY: 122 mg/dL — AB (ref 65–99)
GLUCOSE-CAPILLARY: 150 mg/dL — AB (ref 65–99)
Glucose-Capillary: 139 mg/dL — ABNORMAL HIGH (ref 65–99)
Glucose-Capillary: 224 mg/dL — ABNORMAL HIGH (ref 65–99)

## 2017-12-29 LAB — CBC
HCT: 37.4 % (ref 35.0–47.0)
Hemoglobin: 12.6 g/dL (ref 12.0–16.0)
MCH: 32.4 pg (ref 26.0–34.0)
MCHC: 33.6 g/dL (ref 32.0–36.0)
MCV: 96.5 fL (ref 80.0–100.0)
PLATELETS: 328 10*3/uL (ref 150–440)
RBC: 3.88 MIL/uL (ref 3.80–5.20)
RDW: 14.1 % (ref 11.5–14.5)
WBC: 6.6 10*3/uL (ref 3.6–11.0)

## 2017-12-29 LAB — BASIC METABOLIC PANEL
Anion gap: 13 (ref 5–15)
BUN: 42 mg/dL — AB (ref 6–20)
CALCIUM: 8.5 mg/dL — AB (ref 8.9–10.3)
CO2: 26 mmol/L (ref 22–32)
Chloride: 98 mmol/L — ABNORMAL LOW (ref 101–111)
Creatinine, Ser: 1.96 mg/dL — ABNORMAL HIGH (ref 0.44–1.00)
GFR calc Af Amer: 28 mL/min — ABNORMAL LOW (ref >60)
GFR, EST NON AFRICAN AMERICAN: 24 mL/min — AB (ref >60)
GLUCOSE: 133 mg/dL — AB (ref 65–99)
Potassium: 3.1 mmol/L — ABNORMAL LOW (ref 3.5–5.1)
Sodium: 137 mmol/L (ref 135–145)

## 2017-12-29 MED ORDER — POTASSIUM CHLORIDE CRYS ER 20 MEQ PO TBCR
40.0000 meq | EXTENDED_RELEASE_TABLET | Freq: Once | ORAL | Status: AC
  Administered 2017-12-29: 40 meq via ORAL
  Filled 2017-12-29: qty 2

## 2017-12-29 MED ORDER — TIMOLOL MALEATE 0.5 % OP SOLN
1.0000 [drp] | Freq: Two times a day (BID) | OPHTHALMIC | Status: DC
  Administered 2017-12-29 – 2017-12-31 (×3): 1 [drp] via OPHTHALMIC
  Filled 2017-12-29: qty 5

## 2017-12-29 NOTE — Consult Note (Signed)
[facility] Cardiology  CARDIOLOGY CONSULT NOTE  Patient ID: Cynthia Friedman MRN: [id number] DOB/AGE: 1941-06-25 77 y.o.  Admit date: 12/28/2017 Referring Physician Mody Primary Physician [facility] Primary Cardiologist Fath Reason for Consultation atrial fibrillation  HPI: 77 year old female referred for evaluation of atrial fibrillation.  The patient's had a history of persistent atrial fibrillation, status post cardioversion, reverted back to atrial fibrillation.  She was seen by Dr. Lady Gary, started on amiodarone 400 mg twice daily on 12/21/2017 pending repeat cardioversion.  However, patient returns with a one-week history of intractable nausea and vomiting, with dehydration and acute renal failure, BUN and creatinine 45 and 2.28, respectively.  ECG reveals atrial fibrillation at a rate of 121 bpm.  Telemetry reveals atrial fibrillation rate 115 bpm.  Patient asymptomatic with regard atrial fibrillation, denies chest pain shortness of breath palpitations or heart racing.  Review of systems complete and found to be negative unless listed above     Past Medical History:  Diagnosis Date  . Arthritis   . Diabetes mellitus without complication (HCC)   . Dysrhythmia   . Hyperlipidemia   . Hypothyroidism     Past Surgical History:  Procedure Laterality Date  . CHOLECYSTECTOMY    . ELECTROPHYSIOLOGIC STUDY N/A 02/14/2016   Procedure: Cardioversion;  Surgeon: Dalia Heading, MD;  Location: [facility] ORS;  Service: Cardiovascular;  Laterality: N/A;  . HERNIA REPAIR      Medications Prior to Admission  Medication Sig Dispense Refill Last Dose  . amiodarone (PACERONE) 200 MG tablet Take 1 tablet by mouth 2 (two) times daily. TAKE 2 TABLET BY MOUTH TWICE A DAY FOR 1 WEEK THEN 1 TABLET TWICE A DAY FOR 3 WEEKS THEN 1 TABLET DAILY  11 12/28/2017 at Unknown time  . apixaban (ELIQUIS) 5 MG TABS tablet Take 1 tablet (5 mg total) by mouth 2 (two) times daily. 60 tablet 3 12/28/2017 at Unknown time  . diltiazem (CARDIZEM CD)  240 MG 24 hr capsule Take 1 capsule (240 mg total) by mouth daily. 30 capsule 2 12/28/2017 at Unknown time  . fenofibrate (TRICOR) 145 MG tablet Take 145 mg by mouth at bedtime.   Past Week at Unknown time  . furosemide (LASIX) 20 MG tablet Take 20 mg by mouth daily.  11 12/28/2017 at Unknown time  . glimepiride (AMARYL) 1 MG tablet Take 1 mg by mouth every evening.   12/28/2017 at Unknown time  . hydrochlorothiazide (HYDRODIURIL) 25 MG tablet Take 25 mg by mouth daily.  5 12/28/2017 at Unknown time  . latanoprost (XALATAN) 0.005 % ophthalmic solution Place 1 drop into both eyes at bedtime.   12/28/2017 at Unknown time  . levothyroxine (SYNTHROID, LEVOTHROID) 75 MCG tablet Take 75 mcg by mouth daily before breakfast.   12/28/2017 at Unknown time  . metFORMIN (GLUCOPHAGE) 500 MG tablet Take 500 mg by mouth 2 (two) times daily with a meal.   12/28/2017 at Unknown time  . pioglitazone (ACTOS) 15 MG tablet Take 15 mg by mouth daily.   12/28/2017 at Unknown time  . timolol (TIMOPTIC-XR) 0.5 % ophthalmic gel-forming Place 1 drop into both eyes daily.   12/28/2017 at Unknown time  . clobetasol ointment (TEMOVATE) 0.05 % Apply 1 application topically 2 (two) times daily.   Not Taking at Unknown time  . metoprolol (LOPRESSOR) 50 MG tablet Take 1 tablet (50 mg total) by mouth 2 (two) times daily. (Patient not taking: Reported on 12/28/2017) 60 tablet 2 Not Taking at Unknown time  . traMADol Janean Sark)  50 MG tablet Take 75 mg by mouth at bedtime.   Not Taking at Unknown time  . triamcinolone cream (KENALOG) 0.1 % Apply 1 application topically 2 (two) times daily.   Not Taking at Unknown time   Social History   Socioeconomic History  . Marital status: Married    Spouse name: Not on file  . Number of children: Not on file  . Years of education: Not on file  . Highest education level: Not on file  Social Needs  . Financial resource strain: Not on file  . Food insecurity - worry: Not on file  . Food insecurity - inability: Not  on file  . Transportation needs - medical: Not on file  . Transportation needs - non-medical: Not on file  Occupational History  . Not on file  Tobacco Use  . Smoking status: Never Smoker  . Smokeless tobacco: Never Used  Substance and Sexual Activity  . Alcohol use: No    Alcohol/week: 0.0 oz  . Drug use: No  . Sexual activity: Not on file  Other Topics Concern  . Not on file  Social History Narrative   Independent, lives at home with husband.    Family History  Problem Relation Age of Onset  . Cancer Mother        Unknown cancer      Review of systems complete and found to be negative unless listed above      PHYSICAL EXAM  General: Well developed, well nourished, in no acute distress HEENT:  Normocephalic and atramatic Neck:  No JVD.  Lungs: Clear bilaterally to auscultation and percussion. Heart: HRRR . Normal S1 and S2 without gallops or murmurs.  Abdomen: Bowel sounds are positive, abdomen soft and non-tender  Msk:  Back normal, normal gait. Normal strength and tone for age. Extremities: No clubbing, cyanosis or edema.   Neuro: Alert and oriented X 3. Psych:  Good affect, responds appropriately  Labs:   Lab Results  Component Value Date   WBC 6.6 12/29/2017   HGB 12.6 12/29/2017   HCT 37.4 12/29/2017   MCV 96.5 12/29/2017   PLT 328 12/29/2017    Recent Labs  Lab 12/28/17 2040 12/29/17 0636  NA 134* 137  K 3.3* 3.1*  CL 94* 98*  CO2 24 26  BUN 45* 42*  CREATININE 2.28* 1.96*  CALCIUM 9.2 8.5*  PROT 8.3*  --   BILITOT 1.1  --   ALKPHOS 36*  --   ALT 38  --   AST 44*  --   GLUCOSE 186* 133*   Lab Results  Component Value Date   TROPONINI 0.04 (H) 01/06/2016   No results found for: CHOL No results found for: HDL No results found for: LDLCALC No results found for: TRIG No results found for: CHOLHDL No results found for: LDLDIRECT    Radiology: Ct Abdomen Pelvis Wo Contrast  Result Date: 12/28/2017 CLINICAL DATA:  Nausea and vomiting.   Acute renal insufficiency. EXAM: CT ABDOMEN AND PELVIS WITHOUT CONTRAST TECHNIQUE: Multidetector CT imaging of the abdomen and pelvis was performed following the standard protocol without IV contrast. COMPARISON:  None. FINDINGS: Lower chest: Minimal bibasilar atelectasis. No consolidation. No pleural effusion. Hepatobiliary: No focal liver abnormality is seen. Status post cholecystectomy. No biliary dilatation. Pancreas: Parenchymal atrophy. No ductal dilatation or inflammation. Spleen: Normal in size without focal abnormality. Adrenals/Urinary Tract: No adrenal nodule. No hydronephrosis or perinephric edema. No calcified urolithiasis. Both ureters are decompressed. Urinary bladder is nondistended. Stomach/Bowel:  Lack of enteric contrast limits evaluation. The stomach is nondistended. Small periampullary duodenal diverticulum without inflammation. Small bowel is nondistended without wall thickening or inflammatory change. Appendix tentatively identified and normal. Small colonic stool burden without colonic wall thickening or inflammatory change. No bowel obstruction. Vascular/Lymphatic: Mild aortic atherosclerosis. No aneurysm. No enlarged abdominal or pelvic lymph nodes. Reproductive: Uterus is quiescent, normal for age.  No adnexal mass. Other: Prior ventral abdominal wall hernia repair without hernia recurrence. No ascites or free air. No intra-abdominal abscess. Musculoskeletal: Prominent Schmorl's node versus superior endplate compression fracture of L5 appears chronic. Degenerative disc disease at L4-L5 and L5-S1. Facet arthropathy in the lower lumbar spine. There are no acute or suspicious osseous abnormalities. IMPRESSION: 1. No acute abnormality or explanation for vomiting. 2. No renal stones or hydronephrosis. 3. Mild Aortic Atherosclerosis (ICD10-I70.0). Electronically Signed   By: Rubye Oaks M.D.   On: 12/28/2017 21:45    EKG: Atrial fibrillation rapid ventricular rate  ASSESSMENT AND PLAN:    1.  Atrial fibrillation with rapid ventricular rate, asymptomatic, on apixaban for stroke prevention 2.  Intractable nausea and vomiting, with recent amiodarone load, currently on 200 mg twice daily  Recommendations  1.  DC amiodarone 2.  Continue metoprolol and Cardizem 3.  Continue apixaban for stroke prevention 4.  IV fluid resuscitation 5.  Carefully monitor renal status 6.  Defer additional antiarrhythmics at this time 7.  Strongly consider catheter ablation of atrial fibrillation as outpatient  Signed: Marcina Millard MD,PhD, [facility] 12/29/2017, 9:31 AM

## 2017-12-29 NOTE — Progress Notes (Addendum)
[employer] [facility] [place] [facility]   PATIENT NAME: Cynthia Friedman    MR#:  [id number]  DATE OF BIRTH:  May 29, 1941  SUBJECTIVE:   Patient with 3 weeks of nausea and vomiting.  REVIEW OF SYSTEMS:    Review of Systems  Constitutional: Negative for fever, chills weight loss HENT: Negative for ear pain, nosebleeds, congestion, facial swelling, rhinorrhea, neck pain, neck stiffness and ear discharge.   Respiratory: Negative for cough, shortness of breath, wheezing  Cardiovascular: Negative for chest pain, palpitations and leg swelling.  Gastrointestinal: Positive for nausea and vomiting over the past 2 weeks. No nausea vomiting swelling. Genitourinary: Negative for dysuria, urgency, frequency, hematuria Musculoskeletal: Negative for back pain or joint pain Neurological: Negative for dizziness, seizures, syncope, focal weakness,  numbness and headaches.  Hematological: Does not bruise/bleed easily.  Psychiatric/Behavioral: Negative for hallucinations, confusion, dysphoric mood    Tolerating Diet: liquid      DRUG ALLERGIES:   Allergies  Allergen Reactions  . Aspirin Other (See Comments)    Reaction:  Ringing in the ears  . Codeine Nausea Only    VITALS:  Blood pressure 135/83, pulse (!) 110, temperature 97.6 F (36.4 C), temperature source Oral, resp. rate 19, height 5\' 7"  (1.702 m), weight 115.1 kg (253 lb 12.8 oz), SpO2 95 %.  PHYSICAL EXAMINATION:  Constitutional: Appears well-developed and well-nourished. No distress. HENT: Normocephalic. Marland Kitchen Oropharynx is clear and moist.  Eyes: Conjunctivae and EOM are normal. PERRLA, no scleral icterus.  Neck: Normal ROM. Neck supple. No JVD. No tracheal deviation. CVS: tachy, S1/S2 +, no murmurs, no gallops, no carotid bruit.  Pulmonary: Effort and breath sounds normal, no stridor, rhonchi, wheezes, rales.  Abdominal: Soft. BS +,  no distension, tenderness, rebound or guarding.  Musculoskeletal: Normal range of  motion. No edema and no tenderness.  Neuro: Alert. CN 2-12 grossly intact. No focal deficits. Skin: Skin is warm and dry. No rash noted. Psychiatric: Normal mood and affect.      LABORATORY PANEL:   CBC Recent Labs  Lab 12/29/17 0636  WBC 6.6  HGB 12.6  HCT 37.4  PLT 328   ------------------------------------------------------------------------------------------------------------------  Chemistries  Recent Labs  Lab 12/28/17 2040 12/29/17 0636  NA 134* 137  K 3.3* 3.1*  CL 94* 98*  CO2 24 26  GLUCOSE 186* 133*  BUN 45* 42*  CREATININE 2.28* 1.96*  CALCIUM 9.2 8.5*  AST 44*  --   ALT 38  --   ALKPHOS 36*  --   BILITOT 1.1  --    ------------------------------------------------------------------------------------------------------------------  Cardiac Enzymes No results for input(s): TROPONINI in the last 168 hours. ------------------------------------------------------------------------------------------------------------------  RADIOLOGY:  Ct Abdomen Pelvis Wo Contrast  Result Date: 12/28/2017 CLINICAL DATA:  Nausea and vomiting.  Acute renal insufficiency. EXAM: CT ABDOMEN AND PELVIS WITHOUT CONTRAST TECHNIQUE: Multidetector CT imaging of the abdomen and pelvis was performed following the standard protocol without IV contrast. COMPARISON:  None. FINDINGS: Lower chest: Minimal bibasilar atelectasis. No consolidation. No pleural effusion. Hepatobiliary: No focal liver abnormality is seen. Status post cholecystectomy. No biliary dilatation. Pancreas: Parenchymal atrophy. No ductal dilatation or inflammation. Spleen: Normal in size without focal abnormality. Adrenals/Urinary Tract: No adrenal nodule. No hydronephrosis or perinephric edema. No calcified urolithiasis. Both ureters are decompressed. Urinary bladder is nondistended. Stomach/Bowel: Lack of enteric contrast limits evaluation. The stomach is nondistended. Small periampullary duodenal diverticulum without  inflammation. Small bowel is nondistended without wall thickening or inflammatory change. Appendix tentatively identified and normal. Small colonic stool burden  without colonic wall thickening or inflammatory change. No bowel obstruction. Vascular/Lymphatic: Mild aortic atherosclerosis. No aneurysm. No enlarged abdominal or pelvic lymph nodes. Reproductive: Uterus is quiescent, normal for age.  No adnexal mass. Other: Prior ventral abdominal wall hernia repair without hernia recurrence. No ascites or free air. No intra-abdominal abscess. Musculoskeletal: Prominent Schmorl's node versus superior endplate compression fracture of L5 appears chronic. Degenerative disc disease at L4-L5 and L5-S1. Facet arthropathy in the lower lumbar spine. There are no acute or suspicious osseous abnormalities. IMPRESSION: 1. No acute abnormality or explanation for vomiting. 2. No renal stones or hydronephrosis. 3. Mild Aortic Atherosclerosis (ICD10-I70.0). Electronically Signed   By: Rubye OaksMelanie  Ehinger M.D.   On: 12/28/2017 21:45     ASSESSMENT AND PLAN:   77 year old female with history of diabetes, persistent atrial fibrillation status post cardioversion on anticoagulation who presents with persistent nausea and vomiting.   1. Atrial fibrillation with RVR: Patient's heart rates are better controlled Continue current regimen with  eliquis, diltiazem May need ablation  2. Acute kidney injury in the setting of dehydration with nausea and vomiting Creatinine has improved with IV fluids  3. Nausea and vomiting: May be AMIO related there fore d/c for now Does not sound like gastroparesis.   4. Essential hypertension: Continue metoprolol and diltiazem 5. Hypothyroidism: Continue Synthroid  6. Hypokalemia: Replete 7. DM: SSI for now  Management plans discussed with the patient and she is in agreement.  CODE STATUS: full  TOTAL TIME TAKING CARE OF THIS PATIENT: 30 minutes.     POSSIBLE D/C today or tomorrow,  DEPENDING ON CLINICAL CONDITION.   Darian Ace M.D on 12/29/2017 at 9:28 AM  Between 7am to 6pm - Pager - [phone] After 6pm go to [email or web address] - password [email or web address]  [facility]  Office  [phone]  CC: Primary care physician; Jaclyn Shaggyate, Denny C, MD  Note: This dictation was prepared with Dragon dictation along with smaller phrase technology. Any transcriptional errors that result from this process are unintentional.

## 2017-12-29 NOTE — Progress Notes (Signed)
Patient arrived to 2A Room 256. Patient denies pain and all questions answered. Patient oriented to unit and use of call bell/room phone. Skin assessment completed with Lexi RN and skin intact. A&Ox4, VSS, and Afib on verified tele-box #[id number]. Nursing staff will continue to monitor for any changes in patient status. Lamonte RicherKara A Prerna Harold, RN

## 2017-12-30 DIAGNOSIS — R112 Nausea with vomiting, unspecified: Secondary | ICD-10-CM

## 2017-12-30 LAB — GLUCOSE, CAPILLARY
GLUCOSE-CAPILLARY: 99 mg/dL (ref 65–99)
GLUCOSE-CAPILLARY: 219 mg/dL — AB (ref 65–99)
Glucose-Capillary: 229 mg/dL — ABNORMAL HIGH (ref 65–99)
Glucose-Capillary: 158 mg/dL — ABNORMAL HIGH (ref 65–99)

## 2017-12-30 MED ORDER — METOPROLOL TARTRATE 50 MG PO TABS
50.0000 mg | ORAL_TABLET | Freq: Three times a day (TID) | ORAL | Status: DC
  Administered 2017-12-30 – 2017-12-31 (×3): 50 mg via ORAL
  Filled 2017-12-30 (×3): qty 1

## 2017-12-30 NOTE — Progress Notes (Signed)
[facility] Cardiology  SUBJECTIVE: Patient laying in bed, reports feeling better, with less nausea and vomiting   Vitals:   12/29/17 1944 12/30/17 0355 12/30/17 0358 12/30/17 0811  BP: (!) 110/57 122/75  133/90  Pulse: (!) 58 (!) 110  (!) 122  Resp: 16 20  20   Temp: (!) 97.5 F (36.4 C) 98 F (36.7 C)  (!) 97.5 F (36.4 C)  TempSrc: Oral Oral  Oral  SpO2: 99% 100%  100%  Weight:   116.2 kg (256 lb 2 oz)   Height:         Intake/Output Summary (Last 24 hours) at 12/30/2017 0859 Last data filed at 12/30/2017 1610 Gross per 24 hour  Intake 2738.75 ml  Output 1800 ml  Net 938.75 ml      PHYSICAL EXAM  General: Well developed, well nourished, in no acute distress HEENT:  Normocephalic and atramatic Neck:  No JVD.  Lungs: Clear bilaterally to auscultation and percussion. Heart: HRRR . Normal S1 and S2 without gallops or murmurs.  Abdomen: Bowel sounds are positive, abdomen soft and non-tender  Msk:  Back normal, normal gait. Normal strength and tone for age. Extremities: No clubbing, cyanosis or edema.   Neuro: Alert and oriented X 3. Psych:  Good affect, responds appropriately   LABS: Basic Metabolic Panel: Recent Labs    12/28/17 2040 12/29/17 0636  NA 134* 137  K 3.3* 3.1*  CL 94* 98*  CO2 24 26  GLUCOSE 186* 133*  BUN 45* 42*  CREATININE 2.28* 1.96*  CALCIUM 9.2 8.5*   Liver Function Tests: Recent Labs    12/28/17 2040  AST 44*  ALT 38  ALKPHOS 36*  BILITOT 1.1  PROT 8.3*  ALBUMIN 4.6   Recent Labs    12/28/17 2040  LIPASE 32   CBC: Recent Labs    12/28/17 2040 12/29/17 0636  WBC 8.7 6.6  HGB 14.5 12.6  HCT 42.7 37.4  MCV 95.4 96.5  PLT 422 328   Cardiac Enzymes: No results for input(s): CKTOTAL, CKMB, CKMBINDEX, TROPONINI in the last 72 hours. BNP: Invalid input(s): POCBNP D-Dimer: No results for input(s): DDIMER in the last 72 hours. Hemoglobin A1C: No results for input(s): HGBA1C in the last 72 hours. Fasting Lipid Panel: No results  for input(s): CHOL, HDL, LDLCALC, TRIG, CHOLHDL, LDLDIRECT in the last 72 hours. Thyroid Function Tests: No results for input(s): TSH, T4TOTAL, T3FREE, THYROIDAB in the last 72 hours.  Invalid input(s): FREET3 Anemia Panel: No results for input(s): VITAMINB12, FOLATE, FERRITIN, TIBC, IRON, RETICCTPCT in the last 72 hours.  Ct Abdomen Pelvis Wo Contrast  Result Date: 12/28/2017 CLINICAL DATA:  Nausea and vomiting.  Acute renal insufficiency. EXAM: CT ABDOMEN AND PELVIS WITHOUT CONTRAST TECHNIQUE: Multidetector CT imaging of the abdomen and pelvis was performed following the standard protocol without IV contrast. COMPARISON:  None. FINDINGS: Lower chest: Minimal bibasilar atelectasis. No consolidation. No pleural effusion. Hepatobiliary: No focal liver abnormality is seen. Status post cholecystectomy. No biliary dilatation. Pancreas: Parenchymal atrophy. No ductal dilatation or inflammation. Spleen: Normal in size without focal abnormality. Adrenals/Urinary Tract: No adrenal nodule. No hydronephrosis or perinephric edema. No calcified urolithiasis. Both ureters are decompressed. Urinary bladder is nondistended. Stomach/Bowel: Lack of enteric contrast limits evaluation. The stomach is nondistended. Small periampullary duodenal diverticulum without inflammation. Small bowel is nondistended without wall thickening or inflammatory change. Appendix tentatively identified and normal. Small colonic stool burden without colonic wall thickening or inflammatory change. No bowel obstruction. Vascular/Lymphatic: Mild aortic atherosclerosis. No aneurysm.  No enlarged abdominal or pelvic lymph nodes. Reproductive: Uterus is quiescent, normal for age.  No adnexal mass. Other: Prior ventral abdominal wall hernia repair without hernia recurrence. No ascites or free air. No intra-abdominal abscess. Musculoskeletal: Prominent Schmorl's node versus superior endplate compression fracture of L5 appears chronic. Degenerative disc  disease at L4-L5 and L5-S1. Facet arthropathy in the lower lumbar spine. There are no acute or suspicious osseous abnormalities. IMPRESSION: 1. No acute abnormality or explanation for vomiting. 2. No renal stones or hydronephrosis. 3. Mild Aortic Atherosclerosis (ICD10-I70.0). Electronically Signed   By: Rubye OaksMelanie  Ehinger M.D.   On: 12/28/2017 21:45     Echo   TELEMETRY: Atrial fibrillation 110-120 bpm:  ASSESSMENT AND PLAN:  Active Problems:   Atrial fibrillation (HCC)    1.  Atrial fibrillation with rapid ventricular rate, asymptomatic, on apixaban for stroke prevention 2.  Intractable nausea and vomiting, following recent amiodarone load, improved today, after discontinuing amiodarone yesterday, upper endoscopy pending for tomorrow  Recommendations  1.  Continue to hold amiodarone for now 2.  Continue apixaban for stroke prevention 3.  Up titrate metoprolol to 50 mg 3 times daily 4.  Defer additional antiarrhythmics at this time 5.  Consider outpatient catheter ablation of atrial fibrillation   Marcina MillardAlexander Tiki Tucciarone, MD, PhD, [facility] 12/30/2017 8:59 AM

## 2017-12-30 NOTE — Evaluation (Signed)
Physical Therapy Evaluation Patient Details Name: Cynthia Friedman MRN: [id number] DOB: 1940-11-02 Today's Date: 12/30/2017   History of Present Illness  Patient is a 77 year old female admitted for hypokalemia and atrial fibrillation with rapid ventricular response and acute renal failure following c/o nausea and weakness.  PMH includes arthritis, DM, dysrhythmia, HLD and hypothyroidism.  Clinical Impression  Pt is a 77 year old female who lives in a one story home with her husband.  Pt in bathroom upon PT arrival, walking while holding IV pole for balance.  Pt presented with WNL LE strength but decreased UE strength and limited shoulder abduction ROM.  Pt maintains a flexed posture when standing.  She ambulated approximately 40-50 ft in rm with IV pole and PT supervision.  Pt able to perform lower level balance activity such as quiet stance EO and EC without UE support but higher level narrow stance activity as well as dynamic gait activity indicates fall risk. Pt 5xSTS also indicates a decrease in muscle power during functional activity.  PT educated pt concerning importance of use of her [facility] for fall prevention as she is recovering from her hospital stay.  Pt stated that she did not think that she needs PT due to the fact that she walks around in her garden.  PT explained and demonstrated balance training exercises and the principle of overload and pt stated that she would try the exercises at home.  Pt will continue to benefit from skilled PT with focus on balance, proper use of SPC, strength and ROM of UE and functional mobility.    Follow Up Recommendations Outpatient PT    Equipment Recommendations       Recommendations for Other Services       Precautions / Restrictions Precautions Precautions: Fall Precaution Comments: Moderate Fall Risk Restrictions Weight Bearing Restrictions: No      Mobility  Bed Mobility Overal bed mobility: Independent                 Transfers Overall transfer level: Modified independent               General transfer comment: Pt uses bilateral UE to stand from EOB and sometimes requires multiple attempts.  Demonstrates knowledge of body mechanics for most energy efficient transfer.  Pt holds IV pole or counter immediately upon standing.  Ambulation/Gait Ambulation/Gait assistance: Modified independent (Device/Increase time) Ambulation Distance (Feet): 50 Feet Assistive device: (Pt up and ambulating holding IV pole when in room.  PT instructed pt to use her [facility] for fall prevention when DC'ed from hospital.  Educated pt concerning recovery timeline.)     Gait velocity interpretation: at or above normal speed for age/gender General Gait Details: Remains in slightly flexed posture, holding furniture or IV pole, moderate foot clearance, sufficient knee flexion.  Stairs            Wheelchair Mobility    Modified Rankin (Stroke Patients Only)       Balance Overall balance assessment: Needs assistance Sitting-balance support: Feet supported       Standing balance support: Single extremity supported   Standing balance comment: Pt remains in flexed posture and requires UE assistance for higher level static stance: tandem and SLS.             High level balance activites: Side stepping   Standardized Balance Assessment Standardized Balance Assessment : TUG: Timed Up and Go Test     Timed Up and Go Test TUG: Normal TUG(IV pole)  Normal TUG (seconds): 14     Pertinent Vitals/Pain Pain Assessment: No/denies pain    Home Living Family/patient expects to be discharged to:: Private residence Living Arrangements: Spouse/significant other Available Help at Discharge: Family;Available 24 hours/day Type of Home: House Home Access: Stairs to enter Entrance Stairs-Rails: Can reach both Entrance Stairs-Number of Steps: 3 Home Layout: One level Home Equipment: Walker - 2 wheels;Cane - single  point      Prior Function Level of Independence: Independent with assistive device(s)         Comments: Uses SPC for longer distances, keeps RW in trunk of car     Hand Dominance        Extremity/Trunk Assessment   Upper Extremity Assessment Upper Extremity Assessment: Generalized weakness(Shoulder AROM: abduction: BUE; 100 degrees before pt begins to shrug)    Lower Extremity Assessment Lower Extremity Assessment: Overall WFL for tasks assessed(No sensation loss reported)    Cervical / Trunk Assessment Cervical / Trunk Assessment: Normal  Communication   Communication: No difficulties  Cognition Arousal/Alertness: Awake/alert Behavior During Therapy: WFL for tasks assessed/performed Overall Cognitive Status: Within Functional Limits for tasks assessed                                        General Comments      Exercises Other Exercises Other Exercises: Educated pt concerning tandem stance bilateral 2x10 sec, SLS, tandem walking: 2 laps Other Exercises: 5x STS: 30 sec with use of UE   Assessment/Plan    PT Assessment Patient needs continued PT services  PT Problem List Decreased balance;Decreased strength;Decreased range of motion       PT Treatment Interventions DME instruction;Therapeutic activities;Gait training;Therapeutic exercise;Patient/family education;Stair training;Balance training;Functional mobility training    PT Goals (Current goals can be found in the Care Plan section)  Acute Rehab PT Goals Patient Stated Goal: To return home and be able to walk as much as she used to before she got sick. PT Goal Formulation: With patient Time For Goal Achievement: 01/13/18 Potential to Achieve Goals: Good    Frequency Min 2X/week   Barriers to discharge        Co-evaluation               AM-PAC PT "6 Clicks" Daily Activity  Outcome Measure Difficulty turning over in bed (including adjusting bedclothes, sheets and blankets)?:  A Little Difficulty moving from lying on back to sitting on the side of the bed? : A Little Difficulty sitting down on and standing up from a chair with arms (e.g., wheelchair, bedside commode, etc,.)?: A Little Help needed moving to and from a bed to chair (including a wheelchair)?: A Little Help needed walking in hospital room?: A Little Help needed climbing 3-5 steps with a railing? : A Little 6 Click Score: 18    End of Session   Activity Tolerance: Patient tolerated treatment well Patient left: in bed;with call bell/phone within reach;with bed alarm set   PT Visit Diagnosis: Unsteadiness on feet (R26.81)    Time: [id number] PT Time Calculation (min) (ACUTE ONLY): 20 min   Charges:   PT Evaluation $PT Eval Low Complexity: 1 Low PT Treatments $Therapeutic Exercise: 8-22 mins   PT G Codes:   PT G-Codes **NOT FOR INPATIENT CLASS** Functional Assessment Tool Used: AM-PAC 6 Clicks Basic Mobility   Glenetta HewSarah Fatimah Sundquist, PT, DPT  Glenetta HewSarah Arrie Borrelli 12/30/2017, 9:07 AM

## 2017-12-30 NOTE — Progress Notes (Signed)
Consent for EGD signed and on chart.

## 2017-12-30 NOTE — Consult Note (Signed)
Cynthia Miniumarren Mikah Rottinghaus, Cynthia Friedman [facility]  [address] [place], [place] [address] Phone: [phone] Fax : [phone]  Consultation  Referring Provider:     Dr. Juliene Friedman Primary Care Physician:  Cynthia Friedman, Cynthia Friedman, Cynthia Friedman Primary Gastroenterologist: Cynthia FitzUnassigned         Reason for Consultation:     Nausea and vomiting  Date of Admission:  12/28/2017 Date of Consultation:  12/30/2017         HPI:   Cynthia Friedman is a 77 y.o. female who has never been seen by gastroneurology in the past.  The patient reports that she may have been told by her primary care provider that she should have had a colonoscopy but she thinks she may have refused it.  The patient now was admitted with 3 weeks of nausea and vomiting.  The patient states that she started amiodarone only 1 week ago but her nausea and vomiting got so bad over this last week that she came in dehydrated.  The patient reports that when she was weighed on admission she was only down 1/2 pound from her normal despite stating that she had nausea and vomiting with almost everything she ate and was made nauseous by seeing food and smelling food.  She also states that even looking at her pills would cause her to have nausea.  There is no report of any change in bowel habits and she also denies any black stools or bloody stools.  She denies any abdominal pain associated with the nausea and vomiting.  She also reports that yesterday she did not have any nausea vomiting and was able to eat breakfast lunch and dinner.  She thinks she became dehydrated because she states she does not like to drink water and usually only drinks coffee.  Past Medical History:  Diagnosis Date  . Arthritis   . Diabetes mellitus without complication (HCC)   . Dysrhythmia   . Hyperlipidemia   . Hypothyroidism     Past Surgical History:  Procedure Laterality Date  . CHOLECYSTECTOMY    . ELECTROPHYSIOLOGIC STUDY N/A 02/14/2016   Procedure: Cardioversion;  Surgeon: Cynthia HeadingKenneth A Fath, Cynthia Friedman;   Location: [facility] ORS;  Service: Cardiovascular;  Laterality: N/A;  . HERNIA REPAIR      Prior to Admission medications   Medication Sig Start Date End Date Taking? Authorizing Provider  amiodarone (PACERONE) 200 MG tablet Take 1 tablet by mouth 2 (two) times daily. TAKE 2 TABLET BY MOUTH TWICE A DAY FOR 1 WEEK THEN 1 TABLET TWICE A DAY FOR 3 WEEKS THEN 1 TABLET DAILY 12/21/17  Yes Provider, Historical, Cynthia Friedman  apixaban (ELIQUIS) 5 MG TABS tablet Take 1 tablet (5 mg total) by mouth 2 (two) times daily. 01/07/16  Yes Cynthia FinnerPatel, Sona, Cynthia Friedman  diltiazem (CARDIZEM CD) 240 MG 24 hr capsule Take 1 capsule (240 mg total) by mouth daily. 01/07/16  Yes Cynthia FinnerPatel, Sona, Cynthia Friedman  fenofibrate (TRICOR) 145 MG tablet Take 145 mg by mouth at bedtime.   Yes Provider, Historical, Cynthia Friedman  furosemide (LASIX) 20 MG tablet Take 20 mg by mouth daily. 12/01/17  Yes Provider, Historical, Cynthia Friedman  glimepiride (AMARYL) 1 MG tablet Take 1 mg by mouth every evening.   Yes Provider, Historical, Cynthia Friedman  hydrochlorothiazide (HYDRODIURIL) 25 MG tablet Take 25 mg by mouth daily. 12/10/17  Yes Provider, Historical, Cynthia Friedman  latanoprost (XALATAN) 0.005 % ophthalmic solution Place 1 drop into both eyes at bedtime.   Yes Provider, Historical, Cynthia Friedman  levothyroxine (SYNTHROID, LEVOTHROID) 75 MCG tablet  Take 75 mcg by mouth daily before breakfast.   Yes Provider, Historical, Cynthia Friedman  metFORMIN (GLUCOPHAGE) 500 MG tablet Take 500 mg by mouth 2 (two) times daily with a meal.   Yes Provider, Historical, Cynthia Friedman  pioglitazone (ACTOS) 15 MG tablet Take 15 mg by mouth daily.   Yes Provider, Historical, Cynthia Friedman  timolol (TIMOPTIC-XR) 0.5 % ophthalmic gel-forming Place 1 drop into both eyes daily.   Yes Provider, Historical, Cynthia Friedman  clobetasol ointment (TEMOVATE) 0.05 % Apply 1 application topically 2 (two) times daily.    Provider, Historical, Cynthia Friedman  metoprolol (LOPRESSOR) 50 MG tablet Take 1 tablet (50 mg total) by mouth 2 (two) times daily. Patient not taking: Reported on 12/28/2017 01/07/16   Cynthia Finner, Cynthia Friedman    traMADol (ULTRAM) 50 MG tablet Take 75 mg by mouth at bedtime.    Provider, Historical, Cynthia Friedman  triamcinolone cream (KENALOG) 0.1 % Apply 1 application topically 2 (two) times daily.    Provider, Historical, Cynthia Friedman    Family History  Problem Relation Age of Onset  . Cancer Mother        Unknown cancer     Social History   Tobacco Use  . Smoking status: Never Smoker  . Smokeless tobacco: Never Used  Substance Use Topics  . Alcohol use: No    Alcohol/week: 0.0 oz  . Drug use: No    Allergies as of 12/28/2017 - Review Complete 12/28/2017  Allergen Reaction Noted  . Aspirin Other (See Comments) 01/05/2016  . Codeine Nausea Only 02/14/2016    Review of Systems:    All systems reviewed and negative except where noted in HPI.   Physical Exam:  Vital signs in last 24 hours: Temp:  [97.5 F (36.4 Friedman)-98.4 F (36.9 Friedman)] 97.5 F (36.4 Friedman) (03/03 0811) Pulse Rate:  [58-122] 122 (03/03 0811) Resp:  [16-20] 20 (03/03 0811) BP: (110-133)/(57-90) 133/90 (03/03 0811) SpO2:  [99 %-100 %] 100 % (03/03 0811) Weight:  [256 lb 2 oz (116.2 kg)] 256 lb 2 oz (116.2 kg) (03/03 0358) Last BM Date: 12/28/17 General:   Pleasant, cooperative in NAD Head:  Normocephalic and atraumatic. Eyes:   No icterus.   Conjunctiva pink. PERRLA. Ears:  Normal auditory acuity. Neck:  Supple; no masses or thyroidomegaly Lungs: Respirations even and unlabored. Lungs clear to auscultation bilaterally.   No wheezes, crackles, or rhonchi.  Heart:  Regular rate and rhythm;  Without murmur, clicks, rubs or gallops Abdomen:  Soft, nondistended, nontender. Normal bowel sounds. No appreciable masses or hepatomegaly.  No rebound or guarding.  Rectal:  Not performed. Msk:  Symmetrical without gross deformities.    Extremities:  Without edema, cyanosis or clubbing. Neurologic:  Alert and oriented x3;  grossly normal neurologically. Skin:  Intact without significant lesions or rashes. Cervical Nodes:  No significant cervical  adenopathy. Psych:  Alert and cooperative. Normal affect.  LAB RESULTS: Recent Labs    12/28/17 2040 12/29/17 0636  WBC 8.7 6.6  HGB 14.5 12.6  HCT 42.7 37.4  PLT 422 328   BMET Recent Labs    12/28/17 2040 12/29/17 0636  NA 134* 137  K 3.3* 3.1*  CL 94* 98*  CO2 24 26  GLUCOSE 186* 133*  BUN 45* 42*  CREATININE 2.28* 1.96*  CALCIUM 9.2 8.5*   LFT Recent Labs    12/28/17 2040  PROT 8.3*  ALBUMIN 4.6  AST 44*  ALT 38  ALKPHOS 36*  BILITOT 1.1   PT/INR No results for input(s): LABPROT, INR in  the last 72 hours.  STUDIES: Ct Abdomen Pelvis Wo Contrast  Result Date: 12/28/2017 CLINICAL DATA:  Nausea and vomiting.  Acute renal insufficiency. EXAM: CT ABDOMEN AND PELVIS WITHOUT CONTRAST TECHNIQUE: Multidetector CT imaging of the abdomen and pelvis was performed following the standard protocol without IV contrast. COMPARISON:  None. FINDINGS: Lower chest: Minimal bibasilar atelectasis. No consolidation. No pleural effusion. Hepatobiliary: No focal liver abnormality is seen. Status post cholecystectomy. No biliary dilatation. Pancreas: Parenchymal atrophy. No ductal dilatation or inflammation. Spleen: Normal in size without focal abnormality. Adrenals/Urinary Tract: No adrenal nodule. No hydronephrosis or perinephric edema. No calcified urolithiasis. Both ureters are decompressed. Urinary bladder is nondistended. Stomach/Bowel: Lack of enteric contrast limits evaluation. The stomach is nondistended. Small periampullary duodenal diverticulum without inflammation. Small bowel is nondistended without wall thickening or inflammatory change. Appendix tentatively identified and normal. Small colonic stool burden without colonic wall thickening or inflammatory change. No bowel obstruction. Vascular/Lymphatic: Mild aortic atherosclerosis. No aneurysm. No enlarged abdominal or pelvic lymph nodes. Reproductive: Uterus is quiescent, normal for age.  No adnexal mass. Other: Prior ventral  abdominal wall hernia repair without hernia recurrence. No ascites or free air. No intra-abdominal abscess. Musculoskeletal: Prominent Schmorl's node versus superior endplate compression fracture of L5 appears chronic. Degenerative disc disease at L4-L5 and L5-S1. Facet arthropathy in the lower lumbar spine. There are no acute or suspicious osseous abnormalities. IMPRESSION: 1. No acute abnormality or explanation for vomiting. 2. No renal stones or hydronephrosis. 3. Mild Aortic Atherosclerosis (ICD10-I70.0). Electronically Signed   By: Rubye Oaks M.D.   On: 12/28/2017 21:45      Impression / Plan:   TRENESHA ALCAIDE is a 77 y.o. y/o female with nausea vomiting for 3 weeks which has been worse in the last week.  The patient may be having a reaction to her amiodarone although the patient has been having continuous nausea and vomiting for 3 weeks she has not lost any weight.  The patient did very well yesterday and this morning without any further episodes of nausea vomiting.  Despite this I think it would be prudent to evaluate the patient for a possible upper GI source of her nausea and vomiting.  The patient will be set up for a EGD for tomorrow.  Despite not being seen in the past by GI she reports that her daughter had asked if Dr. Mechele Collin could see the patient and she was informed that Dr. Mechele Collin no longer takes inpatient hospital consults or call.  Patient will have her upper endoscopy performed tomorrow by Dr. Allegra Lai. I have discussed risks & benefits which include, but are not limited to, bleeding, infection, perforation & drug reaction.  The patient agrees with this plan & written consent will be obtained.     Thank you for involving me in the care of this patient.      LOS: 2 days   Cynthia Minium, Cynthia Friedman  12/30/2017, 9:26 AM   Note: This dictation was prepared with Dragon dictation along with smaller phrase technology. Any transcriptional errors that result from this process are  unintentional.

## 2017-12-30 NOTE — Progress Notes (Signed)
[employer] [facility] [place] [facility]   PATIENT NAME: Cynthia Friedman    MR#:  [id number]  DATE OF BIRTH:  Apr 01, 1941  SUBJECTIVE:   Patient has tolerated diet well. She does 1 EGD which is planned for tomorrow.  REVIEW OF SYSTEMS:    Review of Systems  Constitutional: Negative for fever, chills weight loss HENT: Negative for ear pain, nosebleeds, congestion, facial swelling, rhinorrhea, neck pain, neck stiffness and ear discharge.   Respiratory: Negative for cough, shortness of breath, wheezing  Cardiovascular: Negative for chest pain, palpitations and leg swelling.  Gastrointestinal:. No nausea vomiting Genitourinary: Negative for dysuria, urgency, frequency, hematuria Musculoskeletal: Negative for back pain or joint pain Neurological: Negative for dizziness, seizures, syncope, focal weakness,  numbness and headaches.  Hematological: Does not bruise/bleed easily.  Psychiatric/Behavioral: Negative for hallucinations, confusion, dysphoric mood    Tolerating Diet: liquid      DRUG ALLERGIES:   Allergies  Allergen Reactions  . Aspirin Other (See Comments)    Reaction:  Ringing in the ears  . Codeine Nausea Only    VITALS:  Blood pressure 133/90, pulse (!) 105, temperature (!) 97.5 F (36.4 C), temperature source Oral, resp. rate 19, height 5\' 7"  (1.702 m), weight 116.2 kg (256 lb 2 oz), SpO2 100 %.  PHYSICAL EXAMINATION:  Constitutional: Appears well-developed and well-nourished. No distress. HENT: Normocephalic. Marland Kitchen Oropharynx is clear and moist.  Eyes: Conjunctivae and EOM are normal. PERRLA, no scleral icterus.  Neck: Normal ROM. Neck supple. No JVD. No tracheal deviation. CVS: tachy,irr irr S1/S2 +, no murmurs, no gallops, no carotid bruit.  Pulmonary: Effort and breath sounds normal, no stridor, rhonchi, wheezes, rales.  Abdominal: Soft. BS +,  no distension, tenderness, rebound or guarding.  Musculoskeletal: Normal range of motion. No edema and no  tenderness.  Neuro: Alert. CN 2-12 grossly intact. No focal deficits. Skin: Skin is warm and dry. No rash noted. Psychiatric: Normal mood and affect.      LABORATORY PANEL:   CBC Recent Labs  Lab 12/29/17 0636  WBC 6.6  HGB 12.6  HCT 37.4  PLT 328   ------------------------------------------------------------------------------------------------------------------  Chemistries  Recent Labs  Lab 12/28/17 2040 12/29/17 0636  NA 134* 137  K 3.3* 3.1*  CL 94* 98*  CO2 24 26  GLUCOSE 186* 133*  BUN 45* 42*  CREATININE 2.28* 1.96*  CALCIUM 9.2 8.5*  AST 44*  --   ALT 38  --   ALKPHOS 36*  --   BILITOT 1.1  --    ------------------------------------------------------------------------------------------------------------------  Cardiac Enzymes No results for input(s): TROPONINI in the last 168 hours. ------------------------------------------------------------------------------------------------------------------  RADIOLOGY:  Ct Abdomen Pelvis Wo Contrast  Result Date: 12/28/2017 CLINICAL DATA:  Nausea and vomiting.  Acute renal insufficiency. EXAM: CT ABDOMEN AND PELVIS WITHOUT CONTRAST TECHNIQUE: Multidetector CT imaging of the abdomen and pelvis was performed following the standard protocol without IV contrast. COMPARISON:  None. FINDINGS: Lower chest: Minimal bibasilar atelectasis. No consolidation. No pleural effusion. Hepatobiliary: No focal liver abnormality is seen. Status post cholecystectomy. No biliary dilatation. Pancreas: Parenchymal atrophy. No ductal dilatation or inflammation. Spleen: Normal in size without focal abnormality. Adrenals/Urinary Tract: No adrenal nodule. No hydronephrosis or perinephric edema. No calcified urolithiasis. Both ureters are decompressed. Urinary bladder is nondistended. Stomach/Bowel: Lack of enteric contrast limits evaluation. The stomach is nondistended. Small periampullary duodenal diverticulum without inflammation. Small bowel is  nondistended without wall thickening or inflammatory change. Appendix tentatively identified and normal. Small colonic stool burden without colonic wall  thickening or inflammatory change. No bowel obstruction. Vascular/Lymphatic: Mild aortic atherosclerosis. No aneurysm. No enlarged abdominal or pelvic lymph nodes. Reproductive: Uterus is quiescent, normal for age.  No adnexal mass. Other: Prior ventral abdominal wall hernia repair without hernia recurrence. No ascites or free air. No intra-abdominal abscess. Musculoskeletal: Prominent Schmorl's node versus superior endplate compression fracture of L5 appears chronic. Degenerative disc disease at L4-L5 and L5-S1. Facet arthropathy in the lower lumbar spine. There are no acute or suspicious osseous abnormalities. IMPRESSION: 1. No acute abnormality or explanation for vomiting. 2. No renal stones or hydronephrosis. 3. Mild Aortic Atherosclerosis (ICD10-I70.0). Electronically Signed   By: Rubye OaksMelanie  Ehinger M.D.   On: 12/28/2017 21:45     ASSESSMENT AND PLAN:   77 year old female with history of diabetes, persistent atrial fibrillation status post cardioversion on anticoagulation who presents with persistent nausea and vomiting.   1. Atrial fibrillation with RVR: Continue current regimen with  eliquis, diltiazem and increased dose of metoprolol We had discontinued amiodarone as this may be etiology of patient's nausea and vomiting. May need outpatent ablation  2. Acute kidney injury in the setting of dehydration with nausea and vomiting Creatinine has improved with IV fluids.  3. Nausea and vomiting: This has resolved. Amiodarone has been discontinued. Patient would like EGD which is planned for tomorrow morning.   4. Essential hypertension: Continue metoprolol and diltiazem 5. Hypothyroidism: Continue Synthroid  6. Hypokalemia: Repleted 7. DM: Continue ADA diet with sliding scale  8. Glaucoma: Continue eyedrops Management plans discussed with  the patient and she is in agreement.  CODE STATUS: full  TOTAL TIME TAKING CARE OF THIS PATIENT: 24 minutes.  Discussed with Dr.Wohl  POSSIBLE D/C  tomorrow, DEPENDING ON CLINICAL CONDITION.   Quint Chestnut M.D on 12/30/2017 at 10:33 AM  Between 7am to 6pm - Pager - [phone] After 6pm go to [email or web address] - password [email or web address]  [facility]  Office  [phone]  CC: Primary care physician; Jaclyn Shaggyate, Denny C, MD  Note: This dictation was prepared with Dragon dictation along with smaller phrase technology. Any transcriptional errors that result from this process are unintentional.

## 2017-12-31 ENCOUNTER — Encounter
Admission: EM | Disposition: A | Discharge status: Home / Self Care | Payer: Self-pay | Source: Home / Self Care | Attending: Internal Medicine

## 2017-12-31 ENCOUNTER — Inpatient Hospital Stay: Payer: Medicare Other | Admitting: Certified Registered"

## 2017-12-31 ENCOUNTER — Encounter: Payer: Self-pay | Admitting: Anesthesiology

## 2017-12-31 DIAGNOSIS — K3189 Other diseases of stomach and duodenum: Secondary | ICD-10-CM

## 2017-12-31 DIAGNOSIS — R110 Nausea: Secondary | ICD-10-CM

## 2017-12-31 DIAGNOSIS — K259 Gastric ulcer, unspecified as acute or chronic, without hemorrhage or perforation: Secondary | ICD-10-CM

## 2017-12-31 HISTORY — PX: ESOPHAGOGASTRODUODENOSCOPY (EGD) WITH PROPOFOL: SHX5813

## 2017-12-31 LAB — BASIC METABOLIC PANEL
ANION GAP: 9 (ref 5–15)
BUN: 30 mg/dL — AB (ref 6–20)
CALCIUM: 8.1 mg/dL — AB (ref 8.9–10.3)
CO2: 26 mmol/L (ref 22–32)
CREATININE: 1.28 mg/dL — AB (ref 0.44–1.00)
Chloride: 106 mmol/L (ref 101–111)
GFR calc Af Amer: 47 mL/min — ABNORMAL LOW (ref >60)
GFR calc non Af Amer: 40 mL/min — ABNORMAL LOW (ref >60)
GLUCOSE: 148 mg/dL — AB (ref 65–99)
Potassium: 3.6 mmol/L (ref 3.5–5.1)
Sodium: 141 mmol/L (ref 135–145)

## 2017-12-31 LAB — GLUCOSE, CAPILLARY
Glucose-Capillary: 122 mg/dL — ABNORMAL HIGH (ref 65–99)
Glucose-Capillary: 149 mg/dL — ABNORMAL HIGH (ref 65–99)

## 2017-12-31 SURGERY — ESOPHAGOGASTRODUODENOSCOPY (EGD) WITH PROPOFOL
Anesthesia: General

## 2017-12-31 MED ORDER — METOPROLOL TARTRATE 50 MG PO TABS: 50.0000 mg | ORAL_TABLET | Freq: Three times a day (TID) | ORAL | 0 refills | Status: DC

## 2017-12-31 MED ORDER — PROPOFOL 500 MG/50ML IV EMUL
INTRAVENOUS | Status: DC | PRN
  Administered 2017-12-31: 130 ug/kg/min via INTRAVENOUS

## 2017-12-31 MED ORDER — TRAZODONE HCL 50 MG PO TABS: 25.0000 mg | ORAL_TABLET | Freq: Every evening | ORAL | 0 refills | Status: DC | PRN

## 2017-12-31 MED ORDER — PANTOPRAZOLE SODIUM 40 MG PO TBEC: 40.0000 mg | DELAYED_RELEASE_TABLET | Freq: Two times a day (BID) | ORAL | 0 refills | Status: DC

## 2017-12-31 MED ORDER — LIDOCAINE HCL (CARDIAC) 20 MG/ML IV SOLN
INTRAVENOUS | Status: DC | PRN
  Administered 2017-12-31: 50 mg via INTRAVENOUS

## 2017-12-31 MED ORDER — PROPOFOL 10 MG/ML IV BOLUS
INTRAVENOUS | Status: DC | PRN
  Administered 2017-12-31: 60 mg via INTRAVENOUS

## 2017-12-31 MED ORDER — OMEPRAZOLE 40 MG PO CPDR: 40.0000 mg | DELAYED_RELEASE_CAPSULE | Freq: Two times a day (BID) | ORAL | 3 refills | Status: DC

## 2017-12-31 MED ORDER — PROPOFOL 500 MG/50ML IV EMUL
INTRAVENOUS | Status: AC
  Filled 2017-12-31: qty 50

## 2017-12-31 MED ORDER — APIXABAN 2.5 MG PO TABS: 2.5000 mg | ORAL_TABLET | Freq: Two times a day (BID) | ORAL | 0 refills | Status: DC

## 2017-12-31 MED ORDER — PANTOPRAZOLE SODIUM 40 MG PO TBEC
40.0000 mg | DELAYED_RELEASE_TABLET | Freq: Two times a day (BID) | ORAL | Status: DC
Start: 2017-12-31 — End: 2017-12-31

## 2017-12-31 NOTE — Discharge Summary (Addendum)
[facility] [place] [facility]   PATIENT NAME: Cynthia Friedman    MR#:  [id number]  DATE OF BIRTH:  1941/01/18  DATE OF ADMISSION:  12/28/2017 ADMITTING PHYSICIAN: Cammy Copa, MD  DATE OF DISCHARGE: 12/31/2017  PRIMARY CARE PHYSICIAN: Jaclyn Shaggy, MD    ADMISSION DIAGNOSIS:  Hypokalemia [E87.6] Persistent atrial fibrillation (HCC) [I48.1] Atrial fibrillation with rapid ventricular response (HCC) [I48.91] Acute renal failure, unspecified acute renal failure type (HCC) [N17.9]  DISCHARGE DIAGNOSIS:  Active Problems:   Atrial fibrillation (HCC)   Non-intractable vomiting with nausea   SECONDARY DIAGNOSIS:   Past Medical History:  Diagnosis Date  . Arthritis   . Diabetes mellitus without complication (HCC)   . Dysrhythmia   . Hyperlipidemia   . Hypothyroidism     HOSPITAL COURSE:   77 year old female with history of diabetes, persistent atrial fibrillation status post cardioversion on anticoagulation who presents with persistent nausea and vomiting.   1. Atrial fibrillation with RVR: Continue current regimen with  eliquis, diltiazem and increased dose of metoprolol We had discontinued amiodarone as this may be etiology of patient's nausea and vomiting. May need outpatent ablation. She will follow up with Dr Lady Gary to discuss this with the next few days. Her dose of Eliquis has been decreased per renal function per pharmacy consult.  2. Acute kidney injury in the setting of dehydration with nausea and vomiting Rulon Abide is improved with IV fluids.  3. Nausea and vomiting: This has resolved. Amiodarone has been discontinued. Patient underwent EGD.EGD showed an ulcer. Sh ewill need PPI BID and follow up with GI.  4. Essential hypertension: Continue metoprolol and diltiazem 5. Hypothyroidism: Continue Synthroid  6. Hypokalemia: This was repleted. 7. DM:  she will resume outpatient medications with exception of metformin. She will continue ADA  diet. She will follow her blood sugars and if metformin needs to be started this can be restarted as outpatient by her PCP.  8. Glaucoma: Continue eyedrops     DISCHARGE CONDITIONS AND DIET:   Stable  Cardiac diabetic diet  CONSULTS OBTAINED:  Treatment Team:  Marcina Millard, MD Midge Minium, MD  DRUG ALLERGIES:   Allergies  Allergen Reactions  . Aspirin Other (See Comments)    Reaction:  Ringing in the ears  . Codeine Nausea Only    DISCHARGE MEDICATIONS:   Allergies as of 12/31/2017      Reactions   Aspirin Other (See Comments)   Reaction:  Ringing in the ears   Codeine Nausea Only      Medication List    STOP taking these medications   amiodarone 200 MG tablet Commonly known as:  PACERONE   furosemide 20 MG tablet Commonly known as:  LASIX   hydrochlorothiazide 25 MG tablet Commonly known as:  HYDRODIURIL   metFORMIN 500 MG tablet Commonly known as:  GLUCOPHAGE     TAKE these medications   apixaban 5 MG Tabs tablet Commonly known as:  ELIQUIS Take 1 tablet (5 mg total) by mouth 2 (two) times daily.   clobetasol ointment 0.05 % Commonly known as:  TEMOVATE Apply 1 application topically 2 (two) times daily.   diltiazem 240 MG 24 hr capsule Commonly known as:  CARDIZEM CD Take 1 capsule (240 mg total) by mouth daily.   fenofibrate 145 MG tablet Commonly known as:  TRICOR Take 145 mg by mouth at bedtime.   glimepiride 1 MG tablet Commonly known as:  AMARYL Take 1 mg by mouth every evening.  latanoprost 0.005 % ophthalmic solution Commonly known as:  XALATAN Place 1 drop into both eyes at bedtime.   levothyroxine 75 MCG tablet Commonly known as:  SYNTHROID, LEVOTHROID Take 75 mcg by mouth daily before breakfast.   metoprolol tartrate 50 MG tablet Commonly known as:  LOPRESSOR Take 1 tablet (50 mg total) by mouth 2 (two) times daily. What changed:  Another medication with the same name was added. Make sure you understand how and  when to take each.   metoprolol tartrate 50 MG tablet Commonly known as:  LOPRESSOR Take 1 tablet (50 mg total) by mouth 3 (three) times daily. What changed:  You were already taking a medication with the same name, and this prescription was added. Make sure you understand how and when to take each.   omeprazole 40 MG capsule Commonly known as:  PRILOSEC Take 1 capsule (40 mg total) by mouth 2 (two) times daily.   pioglitazone 15 MG tablet Commonly known as:  ACTOS Take 15 mg by mouth daily.   timolol 0.5 % ophthalmic gel-forming Commonly known as:  TIMOPTIC-XR Place 1 drop into both eyes daily.   traMADol 50 MG tablet Commonly known as:  ULTRAM Take 75 mg by mouth at bedtime.   traZODone 50 MG tablet Commonly known as:  DESYREL Take 0.5 tablets (25 mg total) by mouth at bedtime as needed for sleep.   triamcinolone cream 0.1 % Commonly known as:  KENALOG Apply 1 application topically 2 (two) times daily.         Today   CHIEF COMPLAINT:  Doing well this am    VITAL SIGNS:  Blood pressure 129/84, pulse (!) 112, temperature (!) 96.5 F (35.8 C), temperature source Tympanic, resp. rate 15, height 5\' 7"  (1.702 m), weight 117.8 kg (259 lb 9.6 oz), SpO2 98 %.   REVIEW OF SYSTEMS:  Review of Systems  Constitutional: Negative.  Negative for chills, fever and malaise/fatigue.  HENT: Negative.  Negative for ear discharge, ear pain, hearing loss, nosebleeds and sore throat.   Eyes: Negative.  Negative for blurred vision and pain.  Respiratory: Negative.  Negative for cough, hemoptysis, shortness of breath and wheezing.   Cardiovascular: Negative.  Negative for chest pain, palpitations and leg swelling.  Gastrointestinal: Negative.  Negative for abdominal pain, blood in stool, diarrhea, nausea and vomiting.  Genitourinary: Negative.  Negative for dysuria.  Musculoskeletal: Negative.  Negative for back pain.  Skin: Negative.   Neurological: Negative for dizziness,  tremors, speech change, focal weakness, seizures and headaches.  Endo/Heme/Allergies: Negative.  Does not bruise/bleed easily.  Psychiatric/Behavioral: Negative.  Negative for depression, hallucinations and suicidal ideas.     PHYSICAL EXAMINATION:  GENERAL:  77 y.o.-year-old patient lying in the bed with no acute distress.  NECK:  Supple, no jugular venous distention. No thyroid enlargement, no tenderness.  LUNGS: Normal breath sounds bilaterally, no wheezing, rales,rhonchi  No use of accessory muscles of respiration.  CARDIOVASCULAR: S1, S2 normal. No murmurs, rubs, or gallops.  ABDOMEN: Soft, non-tender, non-distended. Bowel sounds present. No organomegaly or mass.  EXTREMITIES: No pedal edema, cyanosis, or clubbing.  PSYCHIATRIC: The patient is alert and oriented x 3.  SKIN: No obvious rash, lesion, or ulcer.   DATA REVIEW:   CBC Recent Labs  Lab 12/29/17 0636  WBC 6.6  HGB 12.6  HCT 37.4  PLT 328    Chemistries  Recent Labs  Lab 12/28/17 2040  12/31/17 0428  NA 134*   < > 141  K  3.3*   < > 3.6  CL 94*   < > 106  CO2 24   < > 26  GLUCOSE 186*   < > 148*  BUN 45*   < > 30*  CREATININE 2.28*   < > 1.28*  CALCIUM 9.2   < > 8.1*  AST 44*  --   --   ALT 38  --   --   ALKPHOS 36*  --   --   BILITOT 1.1  --   --    < > = values in this interval not displayed.    Cardiac Enzymes No results for input(s): TROPONINI in the last 168 hours.  Microbiology Results  @MICRORSLT48 @  RADIOLOGY:  No results found.    Allergies as of 12/31/2017      Reactions   Aspirin Other (See Comments)   Reaction:  Ringing in the ears   Codeine Nausea Only      Medication List    STOP taking these medications   amiodarone 200 MG tablet Commonly known as:  PACERONE   furosemide 20 MG tablet Commonly known as:  LASIX   hydrochlorothiazide 25 MG tablet Commonly known as:  HYDRODIURIL   metFORMIN 500 MG tablet Commonly known as:  GLUCOPHAGE     TAKE these medications    apixaban 5 MG Tabs tablet Commonly known as:  ELIQUIS Take 1 tablet (5 mg total) by mouth 2 (two) times daily.   clobetasol ointment 0.05 % Commonly known as:  TEMOVATE Apply 1 application topically 2 (two) times daily.   diltiazem 240 MG 24 hr capsule Commonly known as:  CARDIZEM CD Take 1 capsule (240 mg total) by mouth daily.   fenofibrate 145 MG tablet Commonly known as:  TRICOR Take 145 mg by mouth at bedtime.   glimepiride 1 MG tablet Commonly known as:  AMARYL Take 1 mg by mouth every evening.   latanoprost 0.005 % ophthalmic solution Commonly known as:  XALATAN Place 1 drop into both eyes at bedtime.   levothyroxine 75 MCG tablet Commonly known as:  SYNTHROID, LEVOTHROID Take 75 mcg by mouth daily before breakfast.   metoprolol tartrate 50 MG tablet Commonly known as:  LOPRESSOR Take 1 tablet (50 mg total) by mouth 2 (two) times daily. What changed:  Another medication with the same name was added. Make sure you understand how and when to take each.   metoprolol tartrate 50 MG tablet Commonly known as:  LOPRESSOR Take 1 tablet (50 mg total) by mouth 3 (three) times daily. What changed:  You were already taking a medication with the same name, and this prescription was added. Make sure you understand how and when to take each.   omeprazole 40 MG capsule Commonly known as:  PRILOSEC Take 1 capsule (40 mg total) by mouth 2 (two) times daily.   pioglitazone 15 MG tablet Commonly known as:  ACTOS Take 15 mg by mouth daily.   timolol 0.5 % ophthalmic gel-forming Commonly known as:  TIMOPTIC-XR Place 1 drop into both eyes daily.   traMADol 50 MG tablet Commonly known as:  ULTRAM Take 75 mg by mouth at bedtime.   traZODone 50 MG tablet Commonly known as:  DESYREL Take 0.5 tablets (25 mg total) by mouth at bedtime as needed for sleep.   triamcinolone cream 0.1 % Commonly known as:  KENALOG Apply 1 application topically 2 (two) times daily.           Management plans discussed with the  patient and she is in agreement. Stable for discharge home  Patient should follow up with pcp  CODE STATUS:     Code Status Orders  (From admission, onward)        Start     Ordered   12/28/17 2336  Full code  Continuous     12/28/17 2335    Code Status History    Date Active Date Inactive Code Status Order ID Comments User Context   01/05/2016 21:59 01/07/2016 20:02 Full Code [id number]  Enid Baas, MD Inpatient      TOTAL TIME TAKING CARE OF THIS PATIENT: 38 minutes.    Note: This dictation was prepared with Dragon dictation along with smaller phrase technology. Any transcriptional errors that result from this process are unintentional.  Shamarie Call M.D on 12/31/2017 at 8:41 PM  Between 7am to 6pm - Pager - [phone] After 6pm go to [email or web address] - Social research officer, government  [facility]  Office  [phone]  CC: Primary care physician; Jaclyn Shaggy, MD

## 2017-12-31 NOTE — Progress Notes (Signed)
EGD post procedure note:  Findings:  - Normal duodenal bulb and second portion of the duodenum. - Non-bleeding gastric ulcer with a clean ulcer base (Forrest Class III). - Mucosal changes in the greater curvature of the gastric body. Biopsied. - Normal gastroesophageal junction, cardia and antrum. Biopsied. Clips (MR conditional) were placed. - Normal esophagus.  Recs: - Return patient to hospital ward for possible discharge same day. - Advance diet as tolerated today. - Continue present medications. - Await pathology results. - Return to GI office in 1-2 weeks. - Resume Eliquis (apixaban) at prior dose in 2 days. Refer to primary physician for further adjustment of therapy. - Use a proton pump inhibitor PO BID for 3 months.  Arlyss Repressohini R Dennisse Swader, MD [address]  [address]  [place], [place] [address]  Main: [phone]  Fax: [phone] Pager: [phone]

## 2017-12-31 NOTE — Progress Notes (Incomplete)
Patient alert and oriented, vss, no complaints of pain.  Daughter at bedside.  Patient to be escorted out of hospital via wheelchair.  No questions.  D/c telemtry and PIV

## 2017-12-31 NOTE — Op Note (Addendum)
[facility]enterology Patient Name: Cynthia Friedman Procedure Date: 12/31/2017 12:17 PM MRN: [id number] Account #: [id number] Date of Birth: Mar 27, 1941 Admit Type: Inpatient Age: 77 Room: [facility] ENDO ROOM 2 Gender: Female Note Status: Finalized Procedure:            Upper GI endoscopy Indications:          Nausea with vomiting, Persistent vomiting of unknown                        cause Providers:            Lin Landsman MD, MD Referring MD:         Leona Carry. Hall Busing, MD (Referring MD), Javier Docker. Ubaldo Glassing, MD                        (Referring MD) Medicines:            Monitored Anesthesia Care Complications:        No immediate complications. Estimated blood loss:                        Minimal. Procedure:            Pre-Anesthesia Assessment:                       - Prior to the procedure, a History and Physical was                        performed, and patient medications and allergies were                        reviewed. The patient is competent. The risks and                        benefits of the procedure and the sedation options and                        risks were discussed with the patient. All questions                        were answered and informed consent was obtained.                        Patient identification and proposed procedure were                        verified by the physician, the nurse, the                        anesthesiologist, the anesthetist and the technician in                        the pre-procedure area in the procedure room in the                        endoscopy suite. Mental Status Examination: alert and                        oriented. Airway Examination: normal oropharyngeal  airway and neck mobility. Respiratory Examination:                        clear to auscultation. CV Examination: normal.                        Prophylactic Antibiotics: The patient does not require                         prophylactic antibiotics. Prior Anticoagulants: The                        patient has taken no previous anticoagulant or                        antiplatelet agents. ASA Grade Assessment: III - A                        patient with severe systemic disease. After reviewing                        the risks and benefits, the patient was deemed in                        satisfactory condition to undergo the procedure. The                        anesthesia plan was to use monitored anesthesia care                        (MAC). Immediately prior to administration of                        medications, the patient was re-assessed for adequacy                        to receive sedatives. The heart rate, respiratory rate,                        oxygen saturations, blood pressure, adequacy of                        pulmonary ventilation, and response to care were                        monitored throughout the procedure. The physical status                        of the patient was re-assessed after the procedure.                       After obtaining informed consent, the endoscope was                        passed under direct vision. Throughout the procedure,                        the patient's blood pressure, pulse, and oxygen  saturations were monitored continuously. The Endoscope                        was introduced through the mouth, and advanced to the                        second part of duodenum. The upper GI endoscopy was                        accomplished without difficulty. The patient tolerated                        the procedure well. Findings:      The duodenal bulb and second portion of the duodenum were normal.      One non-bleeding superficial gastric ulcer with a clean ulcer base       (Forrest Class III) was found in the prepyloric region of the stomach.       The lesion was 6 mm in largest dimension.      Diffuse mucosal changes concerning for  dysplasia/metaplasia or       malignancy such as linitis plastica were found on the greater curvature       of the gastric body. Biopsies were taken with a cold forceps for       histology.      The gastroesophageal junction, cardia and gastric antrum were normal.       Biopsies were taken with a cold forceps for Helicobacter pylori testing.       Estimated blood loss was minimal. To prevent bleeding after the biopsy       site from incisura, two hemostatic clips were successfully placed (MR       conditional). There was no bleeding during, or at the end, of the       procedure.      The examined esophagus was normal. Impression:           - Normal duodenal bulb and second portion of the                        duodenum.                       - Non-bleeding gastric ulcer with a clean ulcer base                        (Forrest Class III).                       - Mucosal changes in the greater curvature of the                        gastric body. Biopsied.                       - Normal gastroesophageal junction, cardia and antrum.                        Biopsied. Clips (MR conditional) were placed.                       - Normal esophagus. Recommendation:       - Return patient to hospital ward for possible  discharge same day.                       - Advance diet as tolerated today.                       - Continue present medications.                       - Await pathology results.                       - Return to GI office in 1 week.                       - Resume Eliquis (apixaban) at prior dose in 2 days.                        Refer to primary physician for further adjustment of                        therapy.                       - Use a proton pump inhibitor PO BID for 3 months. Procedure Code(s):    --- Professional ---                       [phone], Esophagogastroduodenoscopy, flexible, transoral;                        with biopsy, single or  multiple Diagnosis Code(s):    --- Professional ---                       K25.9, Gastric ulcer, unspecified as acute or chronic,                        without hemorrhage or perforation                       K31.89, Other diseases of stomach and duodenum                       R11.2, Nausea with vomiting, unspecified                       R11.10, Vomiting, unspecified CPT copyright 2016 American Medical Association. All rights reserved. The codes documented in this report are preliminary and upon coder review may  be revised to meet current compliance requirements. Dr. Ulyess Mort Lin Landsman MD, MD 12/31/2017 12:43:20 PM This report has been signed electronically. Number of Addenda: 0 Note Initiated On: 12/31/2017 12:17 PM      [facility]

## 2017-12-31 NOTE — Discharge Instructions (Signed)

## 2017-12-31 NOTE — Transfer of Care (Signed)
Immediate Anesthesia Transfer of Care Note  Patient: Cynthia Friedman  Procedure(s) Performed: ESOPHAGOGASTRODUODENOSCOPY (EGD) WITH PROPOFOL (N/A )  Patient Location: PACU  Anesthesia Type:General  Level of Consciousness: awake and alert   Airway & Oxygen Therapy: Patient Spontanous Breathing and Patient connected to nasal cannula oxygen  Post-op Assessment: Report given to RN and Post -op Vital signs reviewed and stable  Post vital signs: Reviewed and stable  Last Vitals:  Vitals:   12/31/17 1243 12/31/17 1246  BP: 107/69 107/69  Pulse: (!) 106 (!) 106  Resp: 16 15  Temp: (!) 35.8 C   SpO2: 97% 98%    Last Pain:  Vitals:   12/31/17 1243  TempSrc: Tympanic         Complications: No apparent anesthesia complications

## 2017-12-31 NOTE — Anesthesia Post-op Follow-up Note (Signed)
Anesthesia QCDR form completed.        

## 2017-12-31 NOTE — Anesthesia Postprocedure Evaluation (Signed)
Anesthesia Post Note  Patient: Cynthia Friedman  Procedure(s) Performed: ESOPHAGOGASTRODUODENOSCOPY (EGD) WITH PROPOFOL (N/A )  Patient location during evaluation: Endoscopy Anesthesia Type: General Level of consciousness: awake and alert Pain management: pain level controlled Vital Signs Assessment: post-procedure vital signs reviewed and stable Respiratory status: spontaneous breathing, nonlabored ventilation, respiratory function stable and patient connected to nasal cannula oxygen Cardiovascular status: blood pressure returned to baseline and stable Postop Assessment: no apparent nausea or vomiting Anesthetic complications: no     Last Vitals:  Vitals:   12/31/17 1301 12/31/17 1311  BP: 121/83 129/84  Pulse:  (!) 112  Resp:    Temp:    SpO2:  98%    Last Pain:  Vitals:   12/31/17 1243  TempSrc: Tympanic                 Lenard SimmerAndrew Yostin Malacara

## 2017-12-31 NOTE — Care Management Important Message (Signed)
Important Message  Patient Details  Name: Cynthia ConnorsBrenda W Sohail MRN: [id number] Date of Birth: Mar 15, 1941   Medicare Important Message Given:  Yes Signed IM notice given    Eber HongGreene, Sajan Cheatwood R, RN 12/31/2017, 2:05 PM

## 2017-12-31 NOTE — Anesthesia Preprocedure Evaluation (Signed)
Anesthesia Evaluation  Patient identified by MRN, date of birth, ID band Patient awake    Reviewed: Allergy & Precautions, H&P , NPO status , Patient's Chart, lab work & pertinent test results, reviewed documented beta blocker date and time   History of Anesthesia Complications Negative for: history of anesthetic complications  Airway Mallampati: III   Neck ROM: full    Dental  (+) Poor Dentition   Pulmonary neg pulmonary ROS,           Cardiovascular hypertension, (-) angina(-) CAD, (-) Past MI, (-) Cardiac Stents and (-) CABG + dysrhythmias Atrial Fibrillation (-) Valvular Problems/Murmurs     Neuro/Psych negative neurological ROS  negative psych ROS   GI/Hepatic negative GI ROS, Neg liver ROS,   Endo/Other  diabetesHypothyroidism Morbid obesity  Renal/GU negative Renal ROS  negative genitourinary   Musculoskeletal   Abdominal   Peds  Hematology negative hematology ROS (+)   Anesthesia Other Findings Past Medical History:   Diabetes mellitus without complication (HCC)                 Hypothyroidism                                               Hyperlipidemia                                               Dysrhythmia                                                  Arthritis                                                  Past Surgical History:   HERNIA REPAIR                                                 CHOLECYSTECTOMY                                             BMI    Body Mass Index   39.92 kg/m 2     Reproductive/Obstetrics                             Anesthesia Physical  Anesthesia Plan  ASA: III  Anesthesia Plan: General   Post-op Pain Management:    Induction: Intravenous  PONV Risk Score and Plan: 3 and Propofol infusion  Airway Management Planned: Nasal Cannula  Additional Equipment:   Intra-op Plan:   Post-operative Plan:   Informed Consent: I have  reviewed the patients History and Physical, chart, labs and discussed the procedure including the risks, benefits  and alternatives for the proposed anesthesia with the patient or authorized representative who has indicated his/her understanding and acceptance.   Dental Advisory Given  Plan Discussed with: CRNA  Anesthesia Plan Comments:         Anesthesia Quick Evaluation

## 2017-12-31 NOTE — Progress Notes (Signed)
Pt. Has ran sinus tach throughout the night, up to 110. Will continue to monitor pt.

## 2018-01-01 ENCOUNTER — Encounter: Payer: Self-pay | Admitting: Gastroenterology

## 2018-01-03 LAB — SURGICAL PATHOLOGY

## 2018-01-08 ENCOUNTER — Other Ambulatory Visit: Payer: Self-pay

## 2018-01-08 ENCOUNTER — Telehealth: Payer: Self-pay

## 2018-01-08 DIAGNOSIS — K2940 Chronic atrophic gastritis without bleeding: Secondary | ICD-10-CM

## 2018-01-08 NOTE — Telephone Encounter (Signed)
-----   Message from Toney Reilohini Reddy Vanga, MD sent at 01/03/2018  5:50 PM EST ----- Please inform patient or his daughter that her gastric biopsies from recent upper endoscopy did not reveal any evidence of cancer or precancerous condition. She has a condition called atrophic gastritis. She will need further blood tests in order to investigate the cause. I will discuss in detail during her follow-up visit with me. Please order B12 level, intrinsic factor antibodies, parietal cell antibodies, fasting serum gastrin levels, H. pylori IgG so that I have results by the follow-up visit with me  Lannette Donathohini Vanga, M.D.

## 2018-01-08 NOTE — Telephone Encounter (Signed)
Pt notified of EGD results and request to do additional labs. Pt will go tomorrow, 01/09/18 to have this done.

## 2018-01-09 ENCOUNTER — Telehealth: Payer: Self-pay

## 2018-01-09 ENCOUNTER — Other Ambulatory Visit
Admission: RE | Admit: 2018-01-09 | Discharge: 2018-01-09 | Disposition: A | Discharge status: Home / Self Care | Payer: Medicare Other | Source: Ambulatory Visit | Attending: Gastroenterology | Admitting: Gastroenterology

## 2018-01-09 ENCOUNTER — Other Ambulatory Visit: Payer: Self-pay

## 2018-01-09 DIAGNOSIS — K2940 Chronic atrophic gastritis without bleeding: Secondary | ICD-10-CM | POA: Insufficient documentation

## 2018-01-09 LAB — VITAMIN B12: Vitamin B-12: 63 pg/mL — ABNORMAL LOW (ref 180–914)

## 2018-01-09 MED ORDER — CYANOCOBALAMIN 1000 MCG/ML IJ SOLN: INTRAMUSCULAR | 2 refills | Status: DC

## 2018-01-09 MED ORDER — CYANOCOBALAMIN 1000 MCG/ML IJ SOLN: INTRAMUSCULAR | 2 refills | Status: AC

## 2018-01-09 NOTE — Telephone Encounter (Signed)
-----   Message from Toney Reilohini Reddy Vanga, MD sent at 01/09/2018  1:17 PM EDT ----- Recommend B12 injections 1000mcg daily for 1week, then once weekly for 6 weeks then monthly for 3-606months. Recheck levels in 50month  Thanks RV

## 2018-01-09 NOTE — Telephone Encounter (Signed)
Pt notified of results

## 2018-01-10 LAB — ANTI-PARIETAL ANTIBODY: PARIETAL CELL ANTIBODY-IGG: 56.6 U — AB (ref 0.0–20.0)

## 2018-01-10 LAB — INTRINSIC FACTOR ANTIBODIES: INTRINSIC FACTOR: 1.1 [AU]/ml (ref 0.0–1.1)

## 2018-01-10 LAB — H. PYLORI ANTIBODY, IGG

## 2018-01-10 LAB — GASTRIN: Gastrin: 1702 pg/mL — ABNORMAL HIGH (ref 0–115)

## 2018-01-11 ENCOUNTER — Telehealth: Payer: Self-pay

## 2018-01-11 NOTE — Telephone Encounter (Signed)
FYI, pt went to pick up the B12 injections and they were going to cost her too much money. She will be in on Tuesday, March 19th for a follow up appt and wanted to discuss possibly taking OTC B12. She knows it may take longer for her levels to go up but she can afford the medications.

## 2018-01-15 ENCOUNTER — Ambulatory Visit: Payer: Medicare Other | Admitting: Gastroenterology

## 2018-01-15 ENCOUNTER — Telehealth: Payer: Self-pay | Admitting: Gastroenterology

## 2018-01-15 ENCOUNTER — Encounter: Payer: Self-pay | Admitting: Gastroenterology

## 2018-01-15 VITALS — BP 127/85 | HR 111 | Temp 97.5°F | Ht 67.0 in | Wt 259.0 lb

## 2018-01-15 DIAGNOSIS — E079 Disorder of thyroid, unspecified: Secondary | ICD-10-CM | POA: Insufficient documentation

## 2018-01-15 DIAGNOSIS — K253 Acute gastric ulcer without hemorrhage or perforation: Secondary | ICD-10-CM | POA: Diagnosis not present

## 2018-01-15 DIAGNOSIS — H409 Unspecified glaucoma: Secondary | ICD-10-CM | POA: Insufficient documentation

## 2018-01-15 DIAGNOSIS — K2940 Chronic atrophic gastritis without bleeding: Secondary | ICD-10-CM | POA: Diagnosis not present

## 2018-01-15 DIAGNOSIS — E119 Type 2 diabetes mellitus without complications: Secondary | ICD-10-CM | POA: Insufficient documentation

## 2018-01-15 NOTE — Progress Notes (Signed)
Cynthia Repress, MD [address]  [address]  [place], [place] [address]  Main: [phone]  Fax: [phone]    Gastroenterology Consultation  Referring Provider:     Jaclyn Shaggy, MD Primary Care Physician:  Jaclyn Shaggy, MD Primary Gastroenterologist:  Dr. Arlyss Friedman Reason for Consultation:     Autoimmune gastritis, pernicious anemia        HPI:   Cynthia Friedman is a 77 y.o. female referred by Dr. Jaclyn Shaggy, MD  for consultation & management of autoimmune gastritis, pernicious anemia. Patient recently underwent upper endoscopy in the hospital when she was admitted with 3 weeks history of nausea and vomiting. She was found to have a clean-based antral ulcer and autoimmune metaplastic atrophic gastritis with no evidence of H. Pylori based on gastric biopsies. The history for gastritis was found in gastric body biopsies. On further workup, she is found to have positive antiparietal cell antibodies, negative intrinsic factor antibodies, severe B12 deficiency. Her H. Pylori IgG serologies came back negative. She had elevated gastrin levels which were checked on Protonix 40 mg twice a day. A prescription for B12 injection were sent, patient did not start them yet. She is here to discuss about her condition  NSAIDs: none  Antiplts/Anticoagulants/Anti thrombotics: apixaban for arrhythmia  GI Procedures:  EGD 12/31/2017 - Normal duodenal bulb and second portion of the duodenum. - Non-bleeding gastric ulcer with a clean ulcer base (Forrest Class III). Diffuse mucosal changes concerning for dysplasia/metaplasia or malignancy such as linitis plastica were found on the greater curvature of the gastric body. Biopsies were taken with a cold forceps for histology. The gastroesophageal junction, cardia and gastric antrum were normal. Biopsies were taken with a cold forceps for Helicobacter pylori testing. Estimated blood loss was minimal. The examined esophagus was  normal.  DIAGNOSIS:  A. STOMACH, RANDOM; COLD BIOPSY:  - ANTRAL TYPE MUCOSA WITH MODERATE CHRONIC ACTIVE GASTRITIS.  - IHC STAIN FOR H. PYLORI IS NEGATIVE.  - CONGO RED STAIN IS NEGATIVE.  - NEGATIVE FOR INTESTINAL METAPLASIA, DYSPLASIA AND MALIGNANCY.  - SEE COMMENT.   B. STOMACH, BODY; COLD BIOPSY:  - ANTRAL TYPE MUCOSA WITH MODERATE CHRONIC ACTIVE GASTRITIS, INTESTINAL  METAPLASIA, AND NODULAR ENDOCRINE CELL HYPERPLASIA.  - IHC STAIN FOR H. PYLORI IS NEGATIVE.  - CONGO RED STAIN IS NEGATIVE.  - SEE COMMENT.   Comment:  A CD56 stain highlights nodular endocrine cell hyperplasia. The findings  in both specimens are consistent with metaplastic atrophic gastritis.   Past Medical History:  Diagnosis Date  . Arthritis   . Diabetes mellitus without complication (HCC)   . Dysrhythmia   . Hyperlipidemia   . Hypothyroidism     Past Surgical History:  Procedure Laterality Date  . CHOLECYSTECTOMY    . ELECTROPHYSIOLOGIC STUDY N/A 02/14/2016   Procedure: Cardioversion;  Surgeon: Dalia Heading, MD;  Location: [facility] ORS;  Service: Cardiovascular;  Laterality: N/A;  . ESOPHAGOGASTRODUODENOSCOPY (EGD) WITH PROPOFOL N/A 12/31/2017   Procedure: ESOPHAGOGASTRODUODENOSCOPY (EGD) WITH PROPOFOL;  Surgeon: Toney Reil, MD;  Location: [facility] ENDOSCOPY;  Service: Gastroenterology;  Laterality: N/A;  . HERNIA REPAIR       Current Outpatient Medications:  .  apixaban (ELIQUIS) 5 MG TABS tablet, Take 1 tablet (5 mg total) by mouth 2 (two) times daily., Disp: 60 tablet, Rfl: 3 .  diltiazem (CARDIZEM CD) 240 MG 24 hr capsule, Take 1 capsule (240 mg total) by mouth daily., Disp: 30 capsule, Rfl: 2 .  fenofibrate (TRICOR) 145 MG tablet, Take 145 mg by mouth at bedtime., Disp: , Rfl:  .  glimepiride (AMARYL) 2 MG tablet, Take by mouth., Disp: , Rfl:  .  glipiZIDE (GLUCOTROL) 10 MG tablet, Take 10 mg by mouth., Disp: , Rfl:  .  hydrochlorothiazide (HYDRODIURIL) 25 MG tablet, Take by mouth., Disp: ,  Rfl:  .  latanoprost (XALATAN) 0.005 % ophthalmic solution, Place 1 drop into both eyes at bedtime., Disp: , Rfl:  .  levothyroxine (SYNTHROID, LEVOTHROID) 75 MCG tablet, Take 75 mcg by mouth daily before breakfast., Disp: , Rfl:  .  metoprolol (LOPRESSOR) 50 MG tablet, Take 1 tablet (50 mg total) by mouth 2 (two) times daily., Disp: 60 tablet, Rfl: 2 .  metoprolol tartrate (LOPRESSOR) 50 MG tablet, Take 1 tablet (50 mg total) by mouth 3 (three) times daily., Disp: 80 tablet, Rfl: 0 .  omeprazole (PRILOSEC) 40 MG capsule, Take 1 capsule (40 mg total) by mouth 2 (two) times daily., Disp: 60 capsule, Rfl: 3 .  pioglitazone (ACTOS) 30 MG tablet, TAKE ONE (1) TABLET EACH DAY, Disp: , Rfl: 5 .  timolol (TIMOPTIC-XR) 0.25 % ophthalmic gel-forming, 1 drop once daily., Disp: , Rfl:  .  traZODone (DESYREL) 50 MG tablet, Take 0.5 tablets (25 mg total) by mouth at bedtime as needed for sleep., Disp: 20 tablet, Rfl: 0 .  cyanocobalamin (,VITAMIN B-12,) 1000 MCG/ML injection, Inject 1mL (1,03700mcg) IM daily for 1 week, then once weekly for 6 weeks, then once monthly for 3 to 6 months (Patient not taking: Reported on 01/15/2018), Disp: 10 mL, Rfl: 2 .  metFORMIN (GLUCOPHAGE) 1000 MG tablet, Take 1,000 mg by mouth 2 (two) times daily., Disp: , Rfl: 5  Family History  Problem Relation Age of Onset  . Cancer Mother        Unknown cancer     Social History   Tobacco Use  . Smoking status: Never Smoker  . Smokeless tobacco: Never Used  Substance Use Topics  . Alcohol use: No    Alcohol/week: 0.0 oz  . Drug use: No    Allergies as of 01/15/2018 - Review Complete 01/15/2018  Allergen Reaction Noted  . Aspirin Other (See Comments) 01/05/2016  . Codeine Nausea Only 02/14/2016    Review of Systems:    All systems reviewed and negative except where noted in HPI.   Physical Exam:  BP 127/85   Pulse (!) 111   Temp (!) 97.5 F (36.4 C) (Oral)   Ht 5\' 7"  (1.702 m)   Wt 259 lb (117.5 kg)   BMI 40.57  kg/m  No LMP recorded. Patient is postmenopausal.  General:   Alert,  Well-developed, well-nourished, pleasant and cooperative in NAD Head:  Normocephalic and atraumatic. Eyes:  Sclera clear, no icterus.   Conjunctiva pink. Ears:  Normal auditory acuity. Nose:  No deformity, discharge, or lesions. Mouth:  No deformity or lesions,oropharynx pink & moist. Neck:  Supple; no masses or thyromegaly. Lungs:  Respirations even and unlabored.  Clear throughout to auscultation.   No wheezes, crackles, or rhonchi. No acute distress. Heart:  Regular rate and rhythm; no murmurs, clicks, rubs, or gallops. Abdomen:  Normal bowel sounds. Soft,obese, non-tender and non-distended without masses, hepatosplenomegaly or hernias noted.  No guarding or rebound tenderness.   Rectal: Not performed Msk:  Symmetrical without gross deformities. Good, equal movement & strength bilaterally. Pulses:  Normal pulses noted. Extremities:  No clubbing or edema.  No cyanosis. Neurologic:  Alert and oriented x3;  grossly normal neurologically. Skin:  Intact without significant lesions or rashes. No jaundice. Psych:  Alert and cooperative. Normal mood and affect.  Imaging Studies: reviewed  Assessment and Plan:   Cynthia Friedman is a 77 y.o. 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 Antiparietal cell antibodies positive. There is no evidence of H. Pylori infection based on serologies and gastric biopsies. She has B12 deficiency but did not develop anemia yet.  Autoimmune atrophic metaplastic gastritis: developed antral ulcer Replace B12 deficiency with B12 injections as described. She prefers to come to our clinic for administration of injections which we can provide She does have elevated gastrin levels Recommend gallium- PET to evaluate for neuroendocrine tumor Recheck B12 levels in 3 months She will continue Protonix 2 times  daily for 3 months and then daily  Follow up in 3 months   Cynthia Repress, MD

## 2018-01-15 NOTE — Patient Instructions (Addendum)
You are scheduled for a Octreotide localization test at [facility] on   If you need to reschedule this appointment for any reason, please contact central scheduling at [phone].

## 2018-01-15 NOTE — Telephone Encounter (Signed)
Error

## 2018-01-16 ENCOUNTER — Telehealth: Payer: Self-pay

## 2018-01-16 ENCOUNTER — Other Ambulatory Visit: Payer: Self-pay

## 2018-01-16 DIAGNOSIS — E164 Increased secretion of gastrin: Secondary | ICD-10-CM

## 2018-01-16 NOTE — Telephone Encounter (Signed)
Pt has been scheduled for a PET scan netspot GA 68 Dotatate skull base to mid thigh on Thursday, March 26th at 3:00pm. Left vm letting pt informing her of this information as well as she will receive a call from the [place] dept with additional information.

## 2018-04-08 ENCOUNTER — Other Ambulatory Visit
Admission: RE | Admit: 2018-04-08 | Discharge: 2018-04-08 | Disposition: A | Discharge status: Home / Self Care | Payer: Medicare Other | Source: Ambulatory Visit | Attending: Gastroenterology | Admitting: Gastroenterology

## 2018-04-08 ENCOUNTER — Other Ambulatory Visit: Payer: Self-pay

## 2018-04-08 DIAGNOSIS — E538 Deficiency of other specified B group vitamins: Secondary | ICD-10-CM | POA: Diagnosis present

## 2018-04-08 LAB — VITAMIN B12: VITAMIN B 12: 777 pg/mL (ref 180–914)

## 2018-04-10 ENCOUNTER — Telehealth: Payer: Self-pay

## 2018-04-10 NOTE — Telephone Encounter (Signed)
Pt notified of lab results

## 2018-04-10 NOTE — Telephone Encounter (Signed)
-----   Message from Cynthia Reilohini Reddy Vanga, MD sent at 04/09/2018  6:16 PM EDT ----- Please inform patient that B12 levels came back normal. We can administer B12 injections every 3 months and recheck B12 levels in 3 months  -RV

## 2018-09-02 DIAGNOSIS — M7540 Impingement syndrome of unspecified shoulder: Secondary | ICD-10-CM | POA: Insufficient documentation

## 2019-07-31 DIAGNOSIS — I10 Essential (primary) hypertension: Secondary | ICD-10-CM | POA: Insufficient documentation

## 2020-01-17 ENCOUNTER — Ambulatory Visit: Payer: Medicare Other | Attending: Internal Medicine

## 2020-01-17 DIAGNOSIS — Z23 Encounter for immunization: Secondary | ICD-10-CM

## 2020-01-17 NOTE — Progress Notes (Signed)
   Covid-19 Vaccination Clinic  Name:  Cynthia Friedman    MRN: [id number] DOB: [birth year]  01/17/2020  [name] was observed post Covid-19 immunization for 15 minutes without incident. She was provided with Vaccine Information Sheet and instruction to access the V-Safe system.   [name] was instructed to call 911 with any severe reactions post vaccine: Marland Kitchen Difficulty breathing  . Swelling of face and throat  . A fast heartbeat  . A bad rash all over body  . Dizziness and weakness   Immunizations Administered    Name Date Dose VIS Date Route   Pfizer COVID-19 Vaccine 01/17/2020  9:05 AM 0.3 mL 10/10/2019 Intramuscular   Manufacturer: [employer], [employer]   Lot: [id number]   NDC: [address]-3

## 2020-02-10 ENCOUNTER — Ambulatory Visit: Payer: Medicare Other | Attending: Internal Medicine

## 2020-02-10 DIAGNOSIS — Z23 Encounter for immunization: Secondary | ICD-10-CM

## 2020-02-10 NOTE — Progress Notes (Signed)
.     Covid-19 Vaccination Clinic  Name:  Cynthia Friedman    MRN: [id number] DOB: [birth year]  02/10/2020  Cynthia Friedman was observed post Covid-19 immunization for 15 minutes without incident. She was provided with Vaccine Information Sheet and instruction to access the V-Safe system.   Cynthia Friedman was instructed to call 911 with any severe reactions post vaccine: Marland Kitchen Difficulty breathing  . Swelling of face and throat  . A fast heartbeat  . A bad rash all over body  . Dizziness and weakness   Immunizations Administered    Name Date Dose VIS Date Route   Pfizer COVID-19 Vaccine 02/10/2020  3:54 PM 0.3 mL 10/10/2019 Intramuscular   Manufacturer: [employer], [employer]   Lot: [id number]   NDC: [address]-4

## 2021-11-02 ENCOUNTER — Other Ambulatory Visit: Payer: Self-pay | Admitting: Internal Medicine

## 2021-11-02 ENCOUNTER — Other Ambulatory Visit: Payer: Self-pay

## 2021-11-02 ENCOUNTER — Ambulatory Visit
Admission: RE | Admit: 2021-11-02 | Discharge: 2021-11-02 | Disposition: A | Discharge status: Home / Self Care | Payer: Medicare Other | Source: Ambulatory Visit | Attending: Internal Medicine | Admitting: Internal Medicine

## 2021-11-02 ENCOUNTER — Ambulatory Visit
Admission: RE | Admit: 2021-11-02 | Discharge: 2021-11-02 | Disposition: A | Discharge status: Home / Self Care | Payer: Medicare Other | Attending: Internal Medicine | Admitting: Internal Medicine

## 2021-11-02 DIAGNOSIS — R059 Cough, unspecified: Secondary | ICD-10-CM

## 2022-05-16 LAB — LAB REPORT - SCANNED
A1c: 7.9
EGFR: 59
Free T4: 1.52 ng/dL
TSH: 6.24 — AB (ref 0.41–5.90)

## 2022-08-30 DIAGNOSIS — H401132 Primary open-angle glaucoma, bilateral, moderate stage: Secondary | ICD-10-CM | POA: Diagnosis not present

## 2022-08-30 DIAGNOSIS — H524 Presbyopia: Secondary | ICD-10-CM | POA: Diagnosis not present

## 2022-09-15 DIAGNOSIS — I10 Essential (primary) hypertension: Secondary | ICD-10-CM | POA: Diagnosis not present

## 2022-09-15 DIAGNOSIS — E039 Hypothyroidism, unspecified: Secondary | ICD-10-CM | POA: Diagnosis not present

## 2022-09-16 LAB — LAB REPORT - SCANNED
A1c: 7.8
Albumin, Urine POC: 14.1
Creatinine, POC: 71.2 mg/dL
EGFR: 57
Free T4: 1.38 ng/dL
Microalb Creat Ratio: 20
TSH: 6.14 — AB (ref 0.41–5.90)

## 2022-09-19 DIAGNOSIS — E119 Type 2 diabetes mellitus without complications: Secondary | ICD-10-CM | POA: Diagnosis not present

## 2022-09-19 DIAGNOSIS — I10 Essential (primary) hypertension: Secondary | ICD-10-CM | POA: Diagnosis not present

## 2022-09-19 DIAGNOSIS — I4891 Unspecified atrial fibrillation: Secondary | ICD-10-CM | POA: Diagnosis not present

## 2022-09-19 DIAGNOSIS — E038 Other specified hypothyroidism: Secondary | ICD-10-CM | POA: Diagnosis not present

## 2022-10-28 DIAGNOSIS — Z20822 Contact with and (suspected) exposure to covid-19: Secondary | ICD-10-CM | POA: Diagnosis not present

## 2022-10-28 DIAGNOSIS — R051 Acute cough: Secondary | ICD-10-CM | POA: Diagnosis not present

## 2022-10-28 DIAGNOSIS — U071 COVID-19: Secondary | ICD-10-CM | POA: Diagnosis not present

## 2022-10-28 DIAGNOSIS — Z6837 Body mass index (BMI) 37.0-37.9, adult: Secondary | ICD-10-CM | POA: Diagnosis not present

## 2023-01-22 DIAGNOSIS — I10 Essential (primary) hypertension: Secondary | ICD-10-CM | POA: Diagnosis not present

## 2023-01-22 DIAGNOSIS — E039 Hypothyroidism, unspecified: Secondary | ICD-10-CM | POA: Diagnosis not present

## 2023-01-22 DIAGNOSIS — E119 Type 2 diabetes mellitus without complications: Secondary | ICD-10-CM | POA: Diagnosis not present

## 2023-01-23 LAB — LAB REPORT - SCANNED
A1c: 7.8
EGFR: 54
Free T4: 1.64 ng/dL
TSH: 5.89 (ref 0.41–5.90)

## 2023-01-29 DIAGNOSIS — H401132 Primary open-angle glaucoma, bilateral, moderate stage: Secondary | ICD-10-CM | POA: Diagnosis not present

## 2023-01-30 DIAGNOSIS — E038 Other specified hypothyroidism: Secondary | ICD-10-CM | POA: Diagnosis not present

## 2023-01-30 DIAGNOSIS — I4891 Unspecified atrial fibrillation: Secondary | ICD-10-CM | POA: Diagnosis not present

## 2023-01-30 DIAGNOSIS — I10 Essential (primary) hypertension: Secondary | ICD-10-CM | POA: Diagnosis not present

## 2023-01-30 DIAGNOSIS — E119 Type 2 diabetes mellitus without complications: Secondary | ICD-10-CM | POA: Diagnosis not present

## 2023-06-13 DIAGNOSIS — L821 Other seborrheic keratosis: Secondary | ICD-10-CM | POA: Diagnosis not present

## 2023-06-13 DIAGNOSIS — L578 Other skin changes due to chronic exposure to nonionizing radiation: Secondary | ICD-10-CM | POA: Diagnosis not present

## 2023-06-13 DIAGNOSIS — L718 Other rosacea: Secondary | ICD-10-CM | POA: Diagnosis not present

## 2023-06-13 DIAGNOSIS — L570 Actinic keratosis: Secondary | ICD-10-CM | POA: Diagnosis not present

## 2023-06-22 DIAGNOSIS — E039 Hypothyroidism, unspecified: Secondary | ICD-10-CM | POA: Diagnosis not present

## 2023-06-22 DIAGNOSIS — E119 Type 2 diabetes mellitus without complications: Secondary | ICD-10-CM | POA: Diagnosis not present

## 2023-06-22 DIAGNOSIS — I10 Essential (primary) hypertension: Secondary | ICD-10-CM | POA: Diagnosis not present

## 2023-06-23 LAB — LAB REPORT - SCANNED
A1c: 8.5
EGFR: 54
Free T4: 1.5 ng/dL
TSH: 6.2 — AB (ref 0.41–5.90)

## 2023-06-26 DIAGNOSIS — E038 Other specified hypothyroidism: Secondary | ICD-10-CM | POA: Diagnosis not present

## 2023-06-26 DIAGNOSIS — I10 Essential (primary) hypertension: Secondary | ICD-10-CM | POA: Diagnosis not present

## 2023-06-26 DIAGNOSIS — Z0000 Encounter for general adult medical examination without abnormal findings: Secondary | ICD-10-CM | POA: Diagnosis not present

## 2023-06-26 DIAGNOSIS — E119 Type 2 diabetes mellitus without complications: Secondary | ICD-10-CM | POA: Diagnosis not present

## 2023-08-08 DIAGNOSIS — H401132 Primary open-angle glaucoma, bilateral, moderate stage: Secondary | ICD-10-CM | POA: Diagnosis not present

## 2023-10-22 DIAGNOSIS — I4892 Unspecified atrial flutter: Secondary | ICD-10-CM | POA: Diagnosis not present

## 2023-10-22 DIAGNOSIS — E119 Type 2 diabetes mellitus without complications: Secondary | ICD-10-CM | POA: Diagnosis not present

## 2023-10-22 DIAGNOSIS — I10 Essential (primary) hypertension: Secondary | ICD-10-CM | POA: Diagnosis not present

## 2023-10-22 DIAGNOSIS — I4821 Permanent atrial fibrillation: Secondary | ICD-10-CM | POA: Diagnosis not present

## 2023-11-08 DIAGNOSIS — M7541 Impingement syndrome of right shoulder: Secondary | ICD-10-CM | POA: Diagnosis not present

## 2023-12-21 DIAGNOSIS — I4891 Unspecified atrial fibrillation: Secondary | ICD-10-CM | POA: Diagnosis not present

## 2023-12-21 DIAGNOSIS — M1990 Unspecified osteoarthritis, unspecified site: Secondary | ICD-10-CM | POA: Diagnosis not present

## 2023-12-21 DIAGNOSIS — I10 Essential (primary) hypertension: Secondary | ICD-10-CM | POA: Diagnosis not present

## 2023-12-21 DIAGNOSIS — E119 Type 2 diabetes mellitus without complications: Secondary | ICD-10-CM | POA: Diagnosis not present

## 2023-12-22 LAB — LAB REPORT - SCANNED
A1c: 7.7
Albumin, Urine POC: 27.7
Creatinine, POC: 63.4 mg/dL
EGFR: 55
Free T4: 1.61 ng/dL
Microalb Creat Ratio: 44
TSH: 5.59 (ref 0.41–5.90)

## 2023-12-25 DIAGNOSIS — H401132 Primary open-angle glaucoma, bilateral, moderate stage: Secondary | ICD-10-CM | POA: Diagnosis not present

## 2023-12-25 DIAGNOSIS — H524 Presbyopia: Secondary | ICD-10-CM | POA: Diagnosis not present

## 2024-01-15 DIAGNOSIS — Z0000 Encounter for general adult medical examination without abnormal findings: Secondary | ICD-10-CM | POA: Diagnosis not present

## 2024-01-15 DIAGNOSIS — E119 Type 2 diabetes mellitus without complications: Secondary | ICD-10-CM | POA: Diagnosis not present

## 2024-01-15 DIAGNOSIS — E038 Other specified hypothyroidism: Secondary | ICD-10-CM | POA: Diagnosis not present

## 2024-01-15 DIAGNOSIS — I10 Essential (primary) hypertension: Secondary | ICD-10-CM | POA: Diagnosis not present

## 2024-03-04 DIAGNOSIS — I10 Essential (primary) hypertension: Secondary | ICD-10-CM | POA: Diagnosis not present

## 2024-03-04 DIAGNOSIS — I4892 Unspecified atrial flutter: Secondary | ICD-10-CM | POA: Diagnosis not present

## 2024-03-04 DIAGNOSIS — I4821 Permanent atrial fibrillation: Secondary | ICD-10-CM | POA: Diagnosis not present

## 2024-03-04 DIAGNOSIS — R0602 Shortness of breath: Secondary | ICD-10-CM | POA: Diagnosis not present

## 2024-03-17 DIAGNOSIS — L820 Inflamed seborrheic keratosis: Secondary | ICD-10-CM | POA: Diagnosis not present

## 2024-03-17 DIAGNOSIS — L538 Other specified erythematous conditions: Secondary | ICD-10-CM | POA: Diagnosis not present

## 2024-03-17 DIAGNOSIS — L2989 Other pruritus: Secondary | ICD-10-CM | POA: Diagnosis not present

## 2024-03-17 DIAGNOSIS — L570 Actinic keratosis: Secondary | ICD-10-CM | POA: Diagnosis not present

## 2024-03-17 DIAGNOSIS — L821 Other seborrheic keratosis: Secondary | ICD-10-CM | POA: Diagnosis not present

## 2024-06-03 ENCOUNTER — Ambulatory Visit

## 2024-06-03 VITALS — BP 140/80 | HR 103 | Ht 67.0 in | Wt 229.2 lb

## 2024-06-03 DIAGNOSIS — I480 Paroxysmal atrial fibrillation: Secondary | ICD-10-CM | POA: Diagnosis not present

## 2024-06-03 DIAGNOSIS — I10 Essential (primary) hypertension: Secondary | ICD-10-CM | POA: Diagnosis not present

## 2024-06-03 DIAGNOSIS — E079 Disorder of thyroid, unspecified: Secondary | ICD-10-CM | POA: Diagnosis not present

## 2024-06-03 DIAGNOSIS — E119 Type 2 diabetes mellitus without complications: Secondary | ICD-10-CM

## 2024-06-03 DIAGNOSIS — Z7984 Long term (current) use of oral hypoglycemic drugs: Secondary | ICD-10-CM

## 2024-06-03 MED ORDER — GLIPIZIDE 10 MG PO TABS: 10.0000 mg | ORAL_TABLET | Freq: Every day | ORAL | 0 refills | Status: AC

## 2024-06-03 MED ORDER — JARDIANCE 25 MG PO TABS: 25.0000 mg | ORAL_TABLET | Freq: Every day | ORAL | 0 refills | Status: DC

## 2024-06-03 NOTE — Progress Notes (Signed)
 New Patient Visit   Physician: Tassie Pollett A Cheree Fowles, MD  Patient: Cynthia Friedman   DOB: 1941/06/27   83 y.o. Female  MRN: [id number] Visit Date: 06/03/2024   Chief Complaint  Patient presents with   Establish Care   Subjective  Cynthia Friedman is a 83 y.o. female who presents today as a new patient to establish care.   HPI  Discussed the use of AI scribe software for clinical note transcription with the patient, who gave verbal consent to proceed.  History of Present Illness   Cynthia Friedman is an 83 year old female with atrial fibrillation and diabetes who presents for a new patient visit and medication refills.  Cardiac arrhythmia and blood pressure symptoms - Atrial fibrillation managed with Eliquis , diltiazem  240 mg, metoprolol  50 mg.  Will see cardiology in the next few months  - Last ECHO 2018 - 40-45% EF  Hypertension  - hydrochlorothiazide 25 mg every other day - Occasional dizziness and lightheadedness, particularly when blood pressure is low - Blood pressure checked infrequently - No palpitations - No recent hospitalizations or acute cardiac events  Morbid Obesity  - Patient with a BMI of 35.91.  Diet could be improved.  Physical activity limited by degree of morbid obesity causing chronic knee pain.  Glycemic control and dietary habits - Diabetes managed with Jardiance  25 mg, glipizide  10 mg, metformin  1000 mg twice daily, and Actos  30 mg - Daily blood sugar monitoring in the mornings - Last hemoglobin A1c was 7.7 in February - Blood sugar levels fluctuate, especially after high-carbohydrate foods such as baked potatoes - Does not follow a strict diabetic diet; dislikes bland foods - No hypoglycemic episodes  - Denies neuropathy  - Evidence of CKD on past labs  Thyroid function - Hypothyroidism managed with Synthroid  75 mcg  - Clinically Euthyroid - Thyroid status stable  Glaucoma-on latanoprost  eyedrops that she uses daily and timolol .  She does  follow with ophthalmology - Glaucoma present - No numbness in feet  Musculoskeletal symptoms - Knee pain limits ability to exercise  Sleep patterns and lifestyle - Sleeps between 1 and 2 AM, feels rested - Lives alone - No tobacco or alcohol use - Enjoys reading and watching cooking shows - No regular exercise due to knee pain  Immunization status - Does not receive flu shots or other vaccines         ASSESSMENT & PLAN  Encounter Diagnoses  Name Primary?   Type 2 diabetes mellitus without complication, without long-term current use of insulin  (HCC) Yes   Thyroid disease    Paroxysmal atrial fibrillation (HCC)    Hypertension, essential    Morbid obesity due to excess calories (HCC)     Orders Placed This Encounter  Procedures   Microalbumin / creatinine urine ratio   CBC with Differential/Platelet   Comprehensive metabolic panel with GFR   Hemoglobin A1c   Urinalysis, Routine w reflex microscopic   TSH + free T4    Assessment and Plan    Atrial Fibrillation Atrial fibrillation stable on Eliquis  and diltiazem .  - Continue Eliquis  and diltiazem . - Monitor for palpitations or dizziness. - Cardiologist follow-up in December.  Hypertension Hypertension managed with metoprolol , diltiazem , and hydrochlorothiazide. Occasional dizziness suggests possible hypotension. Advised home monitoring. - Monitor blood pressure at home, especially during dizziness. - Consider medication reduction if blood pressure remains low. - Provide 6 home blood pressure readings at next visit.  Type 2 Diabetes Mellitus  - Continue  Jardiance , glipizide , metformin , and Actos . Hemoglobin A1c 7.7% in February. Discussed medication reduction and dietary improvement. Emphasized hypoglycemia risk. - Refill Jardiance  and glipizide . - Polypharmacy could be improved - Provide dietary guidance for blood sugar management. - Order lab work to monitor diabetes control.  Hypothyroidism Hypothyroidism  well-managed on levothyroxine  75 mcg.  Glaucoma - Ongoing f/u with ophthalmology  General Health Maintenance Discussed dietary modifications for diabetes. Declines vaccinations. Emphasized dietary adjustments. - Provide dietary guidance for diabetes management.  Follow-up Follow-up visit planned to reassess conditions and review home blood pressure readings. Emphasized home monitoring. - Schedule follow-up appointment in 4-5 weeks, around September 13th. - Perform lab work before next appointment. - Review home blood pressure readings at next visit.      Morbid obesity   - Patient chronic conditions could be significantly improved by weight reduction and so we will work with this patient on overall diet.  She may be willing to use a dietitian.  Given her age this is certainly going to reduce her mobility further as time goes on.     Objective  BP (!) 140/80 (BP Location: Left Arm, Patient Position: Sitting, Cuff Size: Large)   Pulse (!) 103   Ht 5' 7 (1.702 m)   Wt 229 lb 4 oz (104 kg)   SpO2 94%   BMI 35.91 kg/m      Review of Systems  Constitutional:  Negative for chills, fever and weight loss.  Eyes:  Negative for blurred vision. h Respiratory:  Negative for cough and shortness of breath.   Cardiovascular:  Negative for chest pain and palpitations.  Skin:  Negative for rash.  Psychiatric/Behavioral:  Negative for depression. The patient is not nervous/anxious.      Physical Exam Physical Exam Vitals reviewed.  Constitutional:      Appearance: Normal appearance. Well-developed with normal weight.  HENT:     Head: Normocephalic and atraumatic.  Normal mucous membranes, no oral lesions Eyes:     Pupils: Pupils are equal, round, and reactive to light.  Neck:     Thyroid: No thyroid mass or thyromegaly.  Cardiovascular:     Rate and Rhythm: Normal rate and regular rhythm. Normal heart sounds. Normal peripheral pulses Pulmonary:     Normal breath sounds with  normal effort Abdominal:   Abdomen is soft, without tenderness or noted hepatosplenomegaly Musculoskeletal:        General: No swelling or edema  Lymphadenopathy:     Cervical: No cervical adenopathy.  Skin:    General: Skin is warm and dry without noticeable rash. Neurological:     General: No focal deficit present.  Psychiatric:        Mood and Affect: Mood, behavior and cognition normal   Past Medical History:  Diagnosis Date   Arthritis    Diabetes mellitus without complication (HCC)    Dysrhythmia    Glaucoma    Hyperlipidemia    Hypothyroidism    Impingement syndrome of shoulder region 09/02/2018   Past Surgical History:  Procedure Laterality Date   CHOLECYSTECTOMY     ELECTROPHYSIOLOGIC STUDY N/A 02/14/2016   Procedure: Cardioversion;  Surgeon: Vinie DELENA Jude, MD;  Location: [facility] ORS;  Service: Cardiovascular;  Laterality: N/A;   ESOPHAGOGASTRODUODENOSCOPY (EGD) WITH PROPOFOL  N/A 12/31/2017   Procedure: ESOPHAGOGASTRODUODENOSCOPY (EGD) WITH PROPOFOL ;  Surgeon: Unk Corinn Skiff, MD;  Location: [facility] ENDOSCOPY;  Service: Gastroenterology;  Laterality: N/A;   GALLBLADDER SURGERY     HERNIA REPAIR     Family Status  Relation Name Status   Mother  Deceased   Father  Deceased   Sister  Alive   Brother  Alive  No partnership data on file   Family History  Problem Relation Age of Onset   Cancer Mother        Unknown cancer   Glaucoma Sister    Glaucoma Brother    Diabetes Brother    Social History   Socioeconomic History   Marital status: Married    Spouse name: Not on file   Number of children: Not on file   Years of education: Not on file   Highest education level: Not asked  Occupational History   Not on file  Tobacco Use   Smoking status: Never   Smokeless tobacco: Never  Vaping Use   Vaping status: Never Used  Substance and Sexual Activity   Alcohol use: Never   Drug use: Never   Sexual activity: Not on file  Other Topics Concern   Not on  file  Social History Narrative   Independent, lives at home with husband.   Social Drivers of Corporate investment banker Strain: Not on file  Food Insecurity: Not on file  Transportation Needs: Not on file  Physical Activity: Not on file  Stress: Not on file  Social Connections: Not on file   Outpatient Medications Prior to Visit  Medication Sig   apixaban  (ELIQUIS ) 5 MG TABS tablet Take 1 tablet (5 mg total) by mouth 2 (two) times daily.   fenofibrate  (TRICOR ) 145 MG tablet Take 145 mg by mouth at bedtime.   glipiZIDE  (GLUCOTROL ) 10 MG tablet Take 10 mg by mouth.   hydrochlorothiazide (HYDRODIURIL) 25 MG tablet Take by mouth.   JARDIANCE  25 MG TABS tablet Take 25 mg by mouth daily.   latanoprost  (XALATAN ) 0.005 % ophthalmic solution Place 1 drop into both eyes at bedtime.   levothyroxine  (SYNTHROID , LEVOTHROID) 75 MCG tablet Take 75 mcg by mouth daily before breakfast.   metFORMIN  (GLUCOPHAGE ) 1000 MG tablet Take 1,000 mg by mouth 2 (two) times daily.   metoprolol  (LOPRESSOR ) 50 MG tablet Take 1 tablet (50 mg total) by mouth 2 (two) times daily.   pioglitazone  (ACTOS ) 30 MG tablet TAKE ONE (1) TABLET EACH DAY   timolol  (TIMOPTIC -XR) 0.25 % ophthalmic gel-forming 1 drop once daily.   cyanocobalamin  (,VITAMIN B-12,) 1000 MCG/ML injection Inject 1mL (1,000mcg) IM daily for 1 week, then once weekly for 6 weeks, then once monthly for 3 to 6 months (Patient not taking: Reported on 01/15/2018)   diltiazem  (CARDIZEM  CD) 240 MG 24 hr capsule Take 1 capsule (240 mg total) by mouth daily.   glimepiride  (AMARYL ) 2 MG tablet Take by mouth.   [DISCONTINUED] metoprolol  tartrate (LOPRESSOR ) 50 MG tablet Take 1 tablet (50 mg total) by mouth 3 (three) times daily.   [DISCONTINUED] omeprazole  (PRILOSEC) 40 MG capsule Take 1 capsule (40 mg total) by mouth 2 (two) times daily.   [DISCONTINUED] traZODone  (DESYREL ) 50 MG tablet Take 0.5 tablets (25 mg total) by mouth at bedtime as needed for sleep.   No  facility-administered medications prior to visit.   Allergies  Allergen Reactions   Aspirin  Other (See Comments)    Reaction:  Ringing in the ears   Codeine Nausea Only    Immunization History  Administered Date(s) Administered   PFIZER(Purple Top)SARS-COV-2 Vaccination 01/17/2020, 02/10/2020    Health Maintenance  Topic Date Due   Medicare Annual Wellness (AWV)  Never done   FOOT EXAM  Never done   OPHTHALMOLOGY EXAM  Never done   DTaP/Tdap/Td (1 - Tdap) Never done   Pneumococcal Vaccine: 50+ Years (1 of 2 - PCV) Never done   Zoster Vaccines- Shingrix (1 of 2) Never done   DEXA SCAN  Never done   COVID-19 Vaccine (3 - 2024-25 season) 07/01/2023   INFLUENZA VACCINE  05/30/2024   HEMOGLOBIN A1C  06/19/2024   Diabetic kidney evaluation - eGFR measurement  12/20/2024   Diabetic kidney evaluation - Urine ACR  12/20/2024   Hepatitis B Vaccines  Aged Out   HPV VACCINES  Aged Out   Meningococcal B Vaccine  Aged Out    Patient Care Team: Mykael Trott A, MD as PCP - General (Family Medicine)  Depression Screen    06/03/2024   10:22 AM  PHQ 2/9 Scores  PHQ - 2 Score 0  PHQ- 9 Score 0     Parris DELENA Juneau, MD  [facility] [phone] (phone) [phone] (fax)  [facility]

## 2024-06-05 ENCOUNTER — Other Ambulatory Visit: Payer: Self-pay

## 2024-06-05 DIAGNOSIS — E079 Disorder of thyroid, unspecified: Secondary | ICD-10-CM

## 2024-06-05 DIAGNOSIS — I10 Essential (primary) hypertension: Secondary | ICD-10-CM

## 2024-06-05 DIAGNOSIS — E119 Type 2 diabetes mellitus without complications: Secondary | ICD-10-CM

## 2024-06-05 DIAGNOSIS — I480 Paroxysmal atrial fibrillation: Secondary | ICD-10-CM

## 2024-07-28 ENCOUNTER — Ambulatory Visit (INDEPENDENT_AMBULATORY_CARE_PROVIDER_SITE_OTHER)

## 2024-07-28 VITALS — BP 133/62 | HR 104 | Ht 67.0 in | Wt 230.8 lb

## 2024-07-28 DIAGNOSIS — E119 Type 2 diabetes mellitus without complications: Secondary | ICD-10-CM | POA: Diagnosis not present

## 2024-07-28 DIAGNOSIS — E079 Disorder of thyroid, unspecified: Secondary | ICD-10-CM | POA: Diagnosis not present

## 2024-07-28 DIAGNOSIS — I480 Paroxysmal atrial fibrillation: Secondary | ICD-10-CM

## 2024-07-28 DIAGNOSIS — Z7984 Long term (current) use of oral hypoglycemic drugs: Secondary | ICD-10-CM

## 2024-07-28 DIAGNOSIS — I10 Essential (primary) hypertension: Secondary | ICD-10-CM

## 2024-07-28 NOTE — Progress Notes (Signed)
 Progress Note  Physician: Shakeel Disney A Evertt Chouinard, MD   HPI: Cynthia Friedman is a 83 y.o. female presenting on 07/28/2024 for Follow-up .  Discussed the use of AI scribe software for clinical note transcription with the patient, who gave verbal consent to proceed.  History of Present Illness       The patient is seen in follow-up.  She did bring her blood pressure log today.  She has a history of atrial fibrillation which has been stable for which she is on metoprolol  50 mg twice daily.  She also has been taking hydrochlorothiazide 25 mg every other day.  No chest pain or shortness of breath  Patient is suspected to be running lower in the blood pressures.  Blood pressure log showing systolic blood pressures ranging from 110s to 130s but generally running low.  Patient having some dizziness from previous visit.  Patient declines influenza vaccine today.    Medical history:  Relevant past medical, surgical, family and social history reviewed and updated as indicated. Interim medical history since our last visit reviewed.  Allergies and medications reviewed and updated.   ROS: Negative unless specifically indicated above in HPI.    Current Outpatient Medications:    apixaban  (ELIQUIS ) 5 MG TABS tablet, Take 1 tablet (5 mg total) by mouth 2 (two) times daily., Disp: 60 tablet, Rfl: 3   diltiazem  (CARDIZEM  CD) 240 MG 24 hr capsule, Take 1 capsule (240 mg total) by mouth daily., Disp: 30 capsule, Rfl: 2   fenofibrate  (TRICOR ) 145 MG tablet, Take 145 mg by mouth at bedtime., Disp: , Rfl:    glimepiride  (AMARYL ) 2 MG tablet, Take by mouth., Disp: , Rfl:    glipiZIDE  (GLUCOTROL ) 10 MG tablet, Take 1 tablet (10 mg total) by mouth daily before breakfast., Disp: 90 tablet, Rfl: 0   hydrochlorothiazide (HYDRODIURIL) 25 MG tablet, Take by mouth., Disp: , Rfl:    JARDIANCE  25 MG TABS tablet, Take 1 tablet (25 mg total) by mouth daily., Disp: 90 tablet, Rfl: 0   latanoprost  (XALATAN )  0.005 % ophthalmic solution, Place 1 drop into both eyes at bedtime., Disp: , Rfl:    levothyroxine  (SYNTHROID , LEVOTHROID) 75 MCG tablet, Take 75 mcg by mouth daily before breakfast., Disp: , Rfl:    metFORMIN  (GLUCOPHAGE ) 1000 MG tablet, Take 1,000 mg by mouth 2 (two) times daily., Disp: , Rfl: 5   metoprolol  (LOPRESSOR ) 50 MG tablet, Take 1 tablet (50 mg total) by mouth 2 (two) times daily., Disp: 60 tablet, Rfl: 2   pioglitazone  (ACTOS ) 30 MG tablet, TAKE ONE (1) TABLET EACH DAY, Disp: , Rfl: 5   timolol  (TIMOPTIC -XR) 0.25 % ophthalmic gel-forming, 1 drop once daily., Disp: , Rfl:    cyanocobalamin  (,VITAMIN B-12,) 1000 MCG/ML injection, Inject 1mL (1,000mcg) IM daily for 1 week, then once weekly for 6 weeks, then once monthly for 3 to 6 months (Patient not taking: Reported on 07/28/2024), Disp: 10 mL, Rfl: 2       Objective:    * There were no vitals taken for this visit.  Wt Readings from Last 3 Encounters:  06/03/24 229 lb 4 oz (104 kg)  01/15/18 259 lb (117.5 kg)  12/31/17 259 lb 9.6 oz (117.8 kg)    Physical Exam  Physical Exam Vitals reviewed.  Constitutional:      Appearance: Normal appearance. Well-developed with normal weight.  Cardiovascular:     Rate and Rhythm: Normal rate and  regular rhythm. Normal heart sounds. Normal peripheral pulses Pulmonary:     Normal breath sounds with normal effort Skin:    General: Skin is warm and dry without noticeable rash. Neurological:     General: No focal deficit present.  Psychiatric:        Mood and Affect: Mood, behavior and cognition normal      Assessment & Plan:  No diagnosis found.  No orders of the defined types were placed in this encounter.    Assessment and Plan     #1 atrial fibrillation.  Patient heart rate tends to run a little bit faster today 105 bpm.  She reports this is normal for her.  She is to continue metoprolol  50 mg daily.  2.  Hypertension.  Patient blood pressure running low given dizziness and  some lightheadedness.  Would like her to stop hydrochlorothiazide and continue to keep blood pressure log.  We will follow-up on this in 2 weeks.  Explained to as she is aging she is more prone to orthostatic hypotension and dehydration.  Patient due for hemoglobin A1c and urine microalbumin for diabetes monitoring as well as TSH.  Will have her complete labs as well before next visit.

## 2024-08-12 ENCOUNTER — Other Ambulatory Visit

## 2024-08-12 DIAGNOSIS — E079 Disorder of thyroid, unspecified: Secondary | ICD-10-CM

## 2024-08-12 DIAGNOSIS — E119 Type 2 diabetes mellitus without complications: Secondary | ICD-10-CM | POA: Diagnosis not present

## 2024-08-13 LAB — COMPREHENSIVE METABOLIC PANEL WITH GFR
AG Ratio: 1.5 (calc) (ref 1.0–2.5)
ALT: 15 U/L (ref 6–29)
AST: 17 U/L (ref 10–35)
Albumin: 4 g/dL (ref 3.6–5.1)
Alkaline phosphatase (APISO): 32 U/L — ABNORMAL LOW (ref 37–153)
BUN: 18 mg/dL (ref 7–25)
CO2: 25 mmol/L (ref 20–32)
Calcium: 8.8 mg/dL (ref 8.6–10.4)
Chloride: 104 mmol/L (ref 98–110)
Creat: 0.93 mg/dL (ref 0.60–0.95)
Globulin: 2.6 g/dL (ref 1.9–3.7)
Glucose, Bld: 118 mg/dL — ABNORMAL HIGH (ref 65–99)
Potassium: 4.5 mmol/L (ref 3.5–5.3)
Sodium: 137 mmol/L (ref 135–146)
Total Bilirubin: 0.5 mg/dL (ref 0.2–1.2)
Total Protein: 6.6 g/dL (ref 6.1–8.1)
eGFR: 62 mL/min/1.73m2 (ref >=60)

## 2024-08-13 LAB — CBC WITH DIFFERENTIAL/PLATELET
Absolute Lymphocytes: 1224 {cells}/uL (ref 850–3900)
Absolute Monocytes: 634 {cells}/uL (ref 200–950)
Basophils Absolute: 29 {cells}/uL (ref 0–200)
Basophils Relative: 0.6 %
Eosinophils Absolute: 110 {cells}/uL (ref 15–500)
Eosinophils Relative: 2.3 %
HCT: 34.1 % — ABNORMAL LOW (ref 35.0–45.0)
Hemoglobin: 9.9 g/dL — ABNORMAL LOW (ref 11.7–15.5)
MCH: 23.5 pg — ABNORMAL LOW (ref 27.0–33.0)
MCHC: 29 g/dL — ABNORMAL LOW (ref 32.0–36.0)
MCV: 80.8 fL (ref 80.0–100.0)
MPV: 10.2 fL (ref 7.5–12.5)
Monocytes Relative: 13.2 %
Neutro Abs: 2803 {cells}/uL (ref 1500–7800)
Neutrophils Relative %: 58.4 %
Platelets: 381 Thousand/uL (ref 140–400)
RBC: 4.22 Million/uL (ref 3.80–5.10)
RDW: 17 % — ABNORMAL HIGH (ref 11.0–15.0)
Total Lymphocyte: 25.5 %
WBC: 4.8 Thousand/uL (ref 3.8–10.8)

## 2024-08-13 LAB — TSH+FREE T4: TSH W/REFLEX TO FT4: 4.43 m[IU]/L (ref 0.40–4.50)

## 2024-08-13 LAB — URINALYSIS, ROUTINE W REFLEX MICROSCOPIC
Bilirubin Urine: NEGATIVE
Hgb urine dipstick: NEGATIVE
Hyaline Cast: NONE SEEN /LPF
Ketones, ur: NEGATIVE
Nitrite: NEGATIVE
Protein, ur: NEGATIVE
RBC / HPF: NONE SEEN /HPF (ref 0–2)
Specific Gravity, Urine: 1.026 (ref 1.001–1.035)
pH: 6.5 (ref 5.0–8.0)

## 2024-08-13 LAB — LIPID PANEL
Cholesterol: 119 mg/dL (ref <200)
HDL: 47 mg/dL — ABNORMAL LOW (ref >=50)
LDL Cholesterol (Calc): 47 mg/dL
Non-HDL Cholesterol (Calc): 72 mg/dL (ref <130)
Total CHOL/HDL Ratio: 2.5 (calc) (ref <5.0)
Triglycerides: 175 mg/dL — ABNORMAL HIGH (ref <150)

## 2024-08-13 LAB — HEMOGLOBIN A1C
Hgb A1c MFr Bld: 7.8 % — ABNORMAL HIGH (ref <5.7)
Mean Plasma Glucose: 177 mg/dL
eAG (mmol/L): 9.8 mmol/L

## 2024-08-13 LAB — MICROALBUMIN / CREATININE URINE RATIO
Creatinine, Urine: 62 mg/dL (ref 20–275)
Microalb Creat Ratio: 35 mg/g{creat} — ABNORMAL HIGH (ref <30)
Microalb, Ur: 2.2 mg/dL

## 2024-08-16 ENCOUNTER — Other Ambulatory Visit: Payer: Self-pay

## 2024-08-16 DIAGNOSIS — E119 Type 2 diabetes mellitus without complications: Secondary | ICD-10-CM

## 2024-08-16 DIAGNOSIS — I480 Paroxysmal atrial fibrillation: Secondary | ICD-10-CM

## 2024-08-16 DIAGNOSIS — E079 Disorder of thyroid, unspecified: Secondary | ICD-10-CM

## 2024-08-16 DIAGNOSIS — I10 Essential (primary) hypertension: Secondary | ICD-10-CM

## 2024-08-18 ENCOUNTER — Ambulatory Visit (INDEPENDENT_AMBULATORY_CARE_PROVIDER_SITE_OTHER)

## 2024-08-18 VITALS — BP 136/74 | HR 102 | Ht 67.0 in | Wt 232.6 lb

## 2024-08-18 DIAGNOSIS — I10 Essential (primary) hypertension: Secondary | ICD-10-CM

## 2024-08-18 DIAGNOSIS — E119 Type 2 diabetes mellitus without complications: Secondary | ICD-10-CM

## 2024-08-18 DIAGNOSIS — D509 Iron deficiency anemia, unspecified: Secondary | ICD-10-CM

## 2024-08-18 DIAGNOSIS — E079 Disorder of thyroid, unspecified: Secondary | ICD-10-CM | POA: Diagnosis not present

## 2024-08-18 DIAGNOSIS — Z7984 Long term (current) use of oral hypoglycemic drugs: Secondary | ICD-10-CM

## 2024-08-18 DIAGNOSIS — Z862 Personal history of diseases of the blood and blood-forming organs and certain disorders involving the immune mechanism: Secondary | ICD-10-CM

## 2024-08-18 DIAGNOSIS — I480 Paroxysmal atrial fibrillation: Secondary | ICD-10-CM

## 2024-08-18 MED ORDER — JARDIANCE 25 MG PO TABS: 25.0000 mg | ORAL_TABLET | Freq: Every day | ORAL | 3 refills | Status: AC

## 2024-08-18 NOTE — Progress Notes (Signed)
 Progress Note  Physician: Jacquiline Zurcher A Raynaldo Falco, MD   HPI: Cynthia Friedman is a 83 y.o. female presenting on 08/18/2024 for Follow-up .  Discussed the use of AI scribe software for clinical note transcription with the patient, who gave verbal consent to proceed.  History of Present Illness   Cynthia Friedman is an 83 year old female with diabetes and hypertension who presents for medication review and management of anemia.  Hypertension - Recent blood pressure readings: 120/80 mmHg and 117/unknown mmHg - No symptoms of lightheadedness or dizziness  Type 2 diabetes mellitus - Hemoglobin A1c: 7.8%, consistent with previous results - Dietary challenges with baked potatoes and potato chips - Enjoys cantaloupe and occasionally eats bananas, avoids dark bananas due to sugar content - Currently taking glipizide  10, jardiance  25.  No episodes of hypoglycemia   Anemia - Current hemoglobin: 9.9 g/dL - History of low A87 levels, previously treated with B12 injections, discontinued after normalization - Does not take a multivitamin or iron supplement due to past intolerance to iron pills - Diet includes vegetables (broccoli) and meats - overall good variety  Fatigue and dyspnea on exertion - Experiences fatigue, attributed to atrial fibrillation - Heart rate typically ranges from 103 to 105 bpm, monitored at home - Feels tired and short of breath when climbing stairs, but not at rest  Constitutional and other symptoms - No recent changes in bowel habits - No urinary symptoms         Medical history:  Relevant past medical, surgical, family and social history reviewed and updated as indicated. Interim medical history since our last visit reviewed.  Allergies and medications reviewed and updated.   ROS: Negative unless specifically indicated above in HPI.    Current Outpatient Medications:    apixaban  (ELIQUIS ) 5 MG TABS tablet, Take 1 tablet (5 mg total) by mouth 2  (two) times daily., Disp: 60 tablet, Rfl: 3   diltiazem  (CARDIZEM  CD) 240 MG 24 hr capsule, Take 1 capsule (240 mg total) by mouth daily., Disp: 30 capsule, Rfl: 2   fenofibrate  (TRICOR ) 145 MG tablet, Take 145 mg by mouth at bedtime., Disp: , Rfl:    glipiZIDE  (GLUCOTROL ) 10 MG tablet, Take 1 tablet (10 mg total) by mouth daily before breakfast., Disp: 90 tablet, Rfl: 0   hydrochlorothiazide (HYDRODIURIL) 25 MG tablet, Take by mouth., Disp: , Rfl:    latanoprost  (XALATAN ) 0.005 % ophthalmic solution, Place 1 drop into both eyes at bedtime., Disp: , Rfl:    levothyroxine  (SYNTHROID , LEVOTHROID) 75 MCG tablet, Take 75 mcg by mouth daily before breakfast., Disp: , Rfl:    metFORMIN  (GLUCOPHAGE ) 1000 MG tablet, Take 1,000 mg by mouth 2 (two) times daily., Disp: , Rfl: 5   metoprolol  (LOPRESSOR ) 50 MG tablet, Take 1 tablet (50 mg total) by mouth 2 (two) times daily., Disp: 60 tablet, Rfl: 2   pioglitazone  (ACTOS ) 30 MG tablet, TAKE ONE (1) TABLET EACH DAY, Disp: , Rfl: 5   timolol  (TIMOPTIC -XR) 0.25 % ophthalmic gel-forming, 1 drop once daily., Disp: , Rfl:    cyanocobalamin  (,VITAMIN B-12,) 1000 MCG/ML injection, Inject 1mL (1,000mcg) IM daily for 1 week, then once weekly for 6 weeks, then once monthly for 3 to 6 months (Patient not taking: Reported on 08/18/2024), Disp: 10 mL, Rfl: 2   JARDIANCE  25 MG TABS tablet, Take 1 tablet (25 mg total) by mouth daily., Disp: 90 tablet, Rfl: 3  Objective:     BP 136/74   Pulse (!) 102   Ht 5' 7 (1.702 m)   Wt 232 lb 9.6 oz (105.5 kg)   SpO2 96%   BMI 36.43 kg/m   Wt Readings from Last 3 Encounters:  08/18/24 232 lb 9.6 oz (105.5 kg)  07/28/24 230 lb 12.8 oz (104.7 kg)  06/03/24 229 lb 4 oz (104 kg)    Physical Exam  Physical Exam Vitals reviewed.  Constitutional:      Appearance: Normal appearance. Well-developed with normal weight.  Cardiovascular:     Rate and Rhythm: Normal rate and regular rhythm. Normal heart sounds. Normal  peripheral pulses Pulmonary:     Normal breath sounds with normal effort Skin:    General: Skin is warm and dry without noticeable rash. Neurological:     General: No focal deficit present.  Psychiatric:        Mood and Affect: Mood, behavior and cognition normal      Assessment & Plan:   Encounter Diagnoses  Name Primary?   Microcytic anemia Yes   Type 2 diabetes mellitus without complication, without long-term current use of insulin  (HCC)    Thyroid disease    Paroxysmal atrial fibrillation (HCC)    Hypertension, essential    History of anemia due to vitamin B12 deficiency     Orders Placed This Encounter  Procedures   CBC with Differential/Platelet   Iron, TIBC and Ferritin Panel   B12 and Folate Panel     Assessment and Plan    Type 2 diabetes mellitus Hemoglobin A1c at 7.8% is acceptable.  - Refill Jardiance  prescription. Continue glipizide   Atrial fibrillation Chronic atrial fibrillation with heart rate 103-105 bpm. Symptoms include occasional fatigue and shortness of breath, possibly overlapping with anemia symptoms. - Monitor heart rate and symptoms, particularly in relation to anemia treatment.  - Continue diltiazem   Anemia Mild anemia with hemoglobin at 9.9 g/dL. Low B12 levels requiring supplementation. Symptoms include fatigue, possibly exacerbated by atrial fibrillation. - Start sublingual B12 supplementation at 1000 mcg daily. - Start a multivitamin with iron. - Recheck hemoglobin, iron, and B12 levels in the first week of December. - Monitor for symptoms of fatigue and shortness of breath.  Hypertension Blood pressure is well-controlled with recent measurements around 120/80 mmHg. - Continue current antihypertensive regimen - metoprolol  50 BID

## 2024-09-24 ENCOUNTER — Encounter: Payer: Self-pay | Admitting: Family Medicine

## 2024-09-24 ENCOUNTER — Telehealth: Payer: Self-pay

## 2024-09-24 ENCOUNTER — Ambulatory Visit (INDEPENDENT_AMBULATORY_CARE_PROVIDER_SITE_OTHER): Admitting: Family Medicine

## 2024-09-24 VITALS — BP 128/82 | HR 107 | Temp 98.4°F | Ht 67.0 in | Wt 227.2 lb

## 2024-09-24 DIAGNOSIS — J9801 Acute bronchospasm: Secondary | ICD-10-CM

## 2024-09-24 DIAGNOSIS — J40 Bronchitis, not specified as acute or chronic: Secondary | ICD-10-CM

## 2024-09-24 MED ORDER — AZITHROMYCIN 250 MG PO TABS: ORAL_TABLET | ORAL | 0 refills | Status: DC

## 2024-09-24 MED ORDER — PROMETHAZINE-DM 6.25-15 MG/5ML PO SYRP: 5.0000 mL | ORAL_SOLUTION | Freq: Every evening | ORAL | 0 refills | Status: DC | PRN

## 2024-09-24 NOTE — Progress Notes (Signed)
 Subjective:    Patient ID: Cynthia Friedman, female    DOB: 03/18/1941, 83 y.o.   MRN: [id number]  Cynthia Friedman is a 83 y.o. female presenting on 09/24/2024 for Cough (About 1 week )  Patient presents for a same day appointment.  PCP Dr Everlene  HPI   Discussed the use of AI scribe software for clinical note transcription with the patient, who gave verbal consent to proceed.  History of Present Illness   Cynthia Friedman is an 83 year old female with atrial fibrillation who presents with a persistent cough.  Cough and upper respiratory symptoms - Onset last Tuesday evening with scratchy throat, followed by persistent cough beginning the next day and worsening daily - Cough is persistent, worse at night, and led to gagging last night - Occasional wheezing sound when lying down at night - No sore throat, fever, sinus congestion, upper respiratory, or ear symptoms - Cough productive of green sputum - No prior use of inhalers and prefers to avoid them - Has used Tessalon Perles (100 mg, three times in 24 hours, total of nine or ten pills since last week) with no significant relief - Uses Robitussin at night for symptom management, seeking stronger medication for cough control - Z-Pak (azithromycin ) has been effective for similar symptoms in the past when lasting a week or more  Medication preferences and allergies - Allergic to codeine, which causes nausea - Prefers non-codeine medications  - History of atrial fibrillation - Denies history of COPD or asthma          06/03/2024   10:22 AM  Depression screen PHQ 2/9  Decreased Interest 0  Down, Depressed, Hopeless 0  PHQ - 2 Score 0  Altered sleeping 0  Tired, decreased energy 0  Change in appetite 0  Feeling bad or failure about yourself  0  Trouble concentrating 0  Moving slowly or fidgety/restless 0  Suicidal thoughts 0  PHQ-9 Score 0      Data saved with a previous flowsheet row definition       06/03/2024    10:22 AM  GAD 7 : Generalized Anxiety Score  Nervous, Anxious, on Edge 0  Control/stop worrying 0  Worry too much - different things 0  Trouble relaxing 0  Restless 0  Easily annoyed or irritable 0  Afraid - awful might happen 0  Total GAD 7 Score 0    Social History   Tobacco Use   Smoking status: Never   Smokeless tobacco: Never  Vaping Use   Vaping status: Never Used  Substance Use Topics   Alcohol use: Never   Drug use: Never    Review of Systems Per HPI unless specifically indicated above     Objective:    BP 128/82 (BP Location: Right Arm, Patient Position: Sitting, Cuff Size: Large)   Pulse (!) 107   Temp 98.4 F (36.9 C) (Oral)   Ht 5' 7 (1.702 m)   Wt 227 lb 4 oz (103.1 kg)   SpO2 97%   BMI 35.59 kg/m   Wt Readings from Last 3 Encounters:  09/24/24 227 lb 4 oz (103.1 kg)  08/18/24 232 lb 9.6 oz (105.5 kg)  07/28/24 230 lb 12.8 oz (104.7 kg)    Physical Exam Vitals and nursing note reviewed.  Constitutional:      General: She is not in acute distress.    Appearance: Normal appearance. She is well-developed. She is not diaphoretic.     Comments:  Well-appearing, comfortable, cooperative  HENT:     Head: Normocephalic and atraumatic.  Eyes:     General:        Right eye: No discharge.        Left eye: No discharge.     Conjunctiva/sclera: Conjunctivae normal.  Neck:     Thyroid: No thyromegaly.  Cardiovascular:     Rate and Rhythm: Normal rate and regular rhythm.     Heart sounds: Normal heart sounds. No murmur heard. Pulmonary:     Effort: Pulmonary effort is normal. No respiratory distress.     Breath sounds: Normal breath sounds. No wheezing or rales.     Comments: Occasional cough Musculoskeletal:        General: Normal range of motion.     Cervical back: Normal range of motion and neck supple.  Lymphadenopathy:     Cervical: No cervical adenopathy.  Skin:    General: Skin is warm and dry.     Findings: No erythema or rash.   Neurological:     Mental Status: She is alert and oriented to person, place, and time.  Psychiatric:        Mood and Affect: Mood normal.        Behavior: Behavior normal.        Thought Content: Thought content normal.     Comments: Well groomed, good eye contact, normal speech and thoughts     Results for orders placed or performed in visit on 07/28/24  CBC with Differential/Platelet   Collection Time: 08/12/24  9:06 AM  Result Value Ref Range   WBC 4.8 3.8 - 10.8 Thousand/uL   RBC 4.22 3.80 - 5.10 Million/uL   Hemoglobin 9.9 (L) 11.7 - 15.5 g/dL   HCT 65.8 (L) 64.9 - 54.9 %   MCV 80.8 80.0 - 100.0 fL   MCH 23.5 (L) 27.0 - 33.0 pg   MCHC 29.0 (L) 32.0 - 36.0 g/dL   RDW 82.9 (H) 88.9 - 84.9 %   Platelets 381 140 - 400 Thousand/uL   MPV 10.2 7.5 - 12.5 fL   Neutro Abs 2,803 1,500 - 7,800 cells/uL   Absolute Lymphocytes 1,224 850 - 3,900 cells/uL   Absolute Monocytes 634 200 - 950 cells/uL   Eosinophils Absolute 110 15 - 500 cells/uL   Basophils Absolute 29 0 - 200 cells/uL   Neutrophils Relative % 58.4 %   Total Lymphocyte 25.5 %   Monocytes Relative 13.2 %   Eosinophils Relative 2.3 %   Basophils Relative 0.6 %  Comprehensive metabolic panel with GFR   Collection Time: 08/12/24  9:06 AM  Result Value Ref Range   Glucose, Bld 118 (H) 65 - 99 mg/dL   BUN 18 7 - 25 mg/dL   Creat 9.06 9.39 - 9.04 mg/dL   eGFR 62 > OR = 60 fO/fpw/8.26f7   BUN/Creatinine Ratio SEE NOTE: 6 - 22 (calc)   Sodium 137 135 - 146 mmol/L   Potassium 4.5 3.5 - 5.3 mmol/L   Chloride 104 98 - 110 mmol/L   CO2 25 20 - 32 mmol/L   Calcium 8.8 8.6 - 10.4 mg/dL   Total Protein 6.6 6.1 - 8.1 g/dL   Albumin 4.0 3.6 - 5.1 g/dL   Globulin 2.6 1.9 - 3.7 g/dL (calc)   AG Ratio 1.5 1.0 - 2.5 (calc)   Total Bilirubin 0.5 0.2 - 1.2 mg/dL   Alkaline phosphatase (APISO) 32 (L) 37 - 153 U/L   AST 17 10 - 35  U/L   ALT 15 6 - 29 U/L  Hemoglobin A1c   Collection Time: 08/12/24  9:06 AM  Result Value Ref Range    Hgb A1c MFr Bld 7.8 (H) <5.7 %   Mean Plasma Glucose 177 mg/dL   eAG (mmol/L) 9.8 mmol/L  Lipid panel   Collection Time: 08/12/24  9:06 AM  Result Value Ref Range   Cholesterol 119 <200 mg/dL   HDL 47 (L) > OR = 50 mg/dL   Triglycerides 824 (H) <150 mg/dL   LDL Cholesterol (Calc) 47 mg/dL (calc)   Total CHOL/HDL Ratio 2.5 <5.0 (calc)   Non-HDL Cholesterol (Calc) 72 <869 mg/dL (calc)  Urinalysis, Routine w reflex microscopic   Collection Time: 08/12/24  9:06 AM  Result Value Ref Range   Color, Urine YELLOW YELLOW   APPearance CLEAR CLEAR   Specific Gravity, Urine 1.026 1.001 - 1.035   pH 6.5 5.0 - 8.0   Glucose, UA 3+ (A) NEGATIVE   Bilirubin Urine NEGATIVE NEGATIVE   Ketones, ur NEGATIVE NEGATIVE   Hgb urine dipstick NEGATIVE NEGATIVE   Protein, ur NEGATIVE NEGATIVE   Nitrite NEGATIVE NEGATIVE   Leukocytes,Ua TRACE (A) NEGATIVE   WBC, UA 10-20 (A) 0 - 5 /HPF   RBC / HPF NONE SEEN 0 - 2 /HPF   Squamous Epithelial / HPF 0-5 < OR = 5 /HPF   Bacteria, UA FEW (A) NONE SEEN /HPF   Hyaline Cast NONE SEEN NONE SEEN /LPF  TSH + free T4   Collection Time: 08/12/24  9:06 AM  Result Value Ref Range   TSH W/REFLEX TO FT4 4.43 0.40 - 4.50 mIU/L  Microalbumin / creatinine urine ratio   Collection Time: 08/12/24  9:06 AM  Result Value Ref Range   Creatinine, Urine 62 20 - 275 mg/dL   Microalb, Ur 2.2 mg/dL   Microalb Creat Ratio 35 (H) <30 mg/g creat      Assessment & Plan:   Problem List Items Addressed This Visit   None Visit Diagnoses       Bronchitis    -  Primary   Relevant Medications   azithromycin  (ZITHROMAX  Z-PAK) 250 MG tablet   promethazine -dextromethorphan (PROMETHAZINE -DM) 6.25-15 MG/5ML syrup     Bronchospasm, acute            Acute bronchitis with bronchospasm Persistent cough with bronchospasm, no fever or sore throat. Lungs clear. No COPD or asthma.  She cannot tolerate codeine cough syrup. Promethazine  chosen for sedative effect. Z-Pak considered due  to symptom duration >1 week and previous success in past  - Start Azithromycin  Z pak (antibiotic) 2 tabs day 1, then 1 tab x 4 days, complete entire course even if improved - Prescribed promethazine  cough syrup for nighttime, warned about sedation. - May use existing Tessalon perls. Hold Robituss OTC - Offered to extend Tessalon perls or adjust dose if needed - Discussed further evaluation with steroid, rescue inhaler, or alternative medications if no improvement by next week. - Advised follow-up if symptoms persist or worsen.        No orders of the defined types were placed in this encounter.   Meds ordered this encounter  Medications   azithromycin  (ZITHROMAX  Z-PAK) 250 MG tablet    Sig: Take 2 tabs (500mg  total) on Day 1. Take 1 tab (250mg ) daily for next 4 days.    Dispense:  6 tablet    Refill:  0   promethazine -dextromethorphan (PROMETHAZINE -DM) 6.25-15 MG/5ML syrup    Sig: Take 5  mLs by mouth at bedtime as needed for cough.    Dispense:  118 mL    Refill:  0    Follow up plan: Return if symptoms worsen or fail to improve.  Marsa Officer, DO [facility] 09/24/2024, 4:05 PM

## 2024-09-24 NOTE — Telephone Encounter (Signed)
 Copied from CRM [id number]. Topic: Clinical - Medical Advice >> Sep 24, 2024  8:50 AM Anairis L wrote: Reason for CRM: Pt has been feeling sick since last Tuesday, tried to schedule today no app available. Would like to see if Dr.Zafirov would send something int to the pharmacy for her.    No cough medicine with codine  [id number] - [place], [place] - [address] [place] [address] Phone: [phone] Fax: [phone] Hours: Not open 24 hours

## 2024-09-24 NOTE — Telephone Encounter (Signed)
Spoke to patient appointment made.

## 2024-09-24 NOTE — Patient Instructions (Addendum)
 Thank you for coming to the office today.  Start Azithromycin  Z pak (antibiotic) 2 tabs day 1, then 1 tab x 4 days, complete entire course even if improved  Cough Syrup, prefer to use at bedtime or evening, caution with sedation.  If not improved by next week, contact us  back.   Please schedule a Follow-up Appointment to: Return if symptoms worsen or fail to improve.  If you have any other questions or concerns, please feel free to call the office or send a message through MyChart. You may also schedule an earlier appointment if necessary.  Additionally, you may be receiving a survey about your experience at our office within a few days to 1 week by e-mail or mail. We value your feedback.  Marsa Officer, DO [facility], [place]

## 2024-09-29 ENCOUNTER — Other Ambulatory Visit

## 2024-09-29 DIAGNOSIS — I480 Paroxysmal atrial fibrillation: Secondary | ICD-10-CM | POA: Diagnosis not present

## 2024-09-29 DIAGNOSIS — D509 Iron deficiency anemia, unspecified: Secondary | ICD-10-CM | POA: Diagnosis not present

## 2024-09-29 DIAGNOSIS — E079 Disorder of thyroid, unspecified: Secondary | ICD-10-CM | POA: Diagnosis not present

## 2024-09-29 DIAGNOSIS — E119 Type 2 diabetes mellitus without complications: Secondary | ICD-10-CM | POA: Diagnosis not present

## 2024-09-29 LAB — CBC WITH DIFFERENTIAL/PLATELET
Absolute Lymphocytes: 1259 {cells}/uL (ref 850–3900)
Absolute Monocytes: 679 {cells}/uL (ref 200–950)
Basophils Absolute: 52 {cells}/uL (ref 0–200)
Basophils Relative: 0.9 %
Eosinophils Absolute: 87 {cells}/uL (ref 15–500)
Eosinophils Relative: 1.5 %
HCT: 36.3 % (ref 35.9–46.0)
Hemoglobin: 10.7 g/dL — ABNORMAL LOW (ref 11.7–15.5)
MCH: 23.9 pg — ABNORMAL LOW (ref 27.0–33.0)
MCHC: 29.5 g/dL — ABNORMAL LOW (ref 31.6–35.4)
MCV: 81 fL — ABNORMAL LOW (ref 81.4–101.7)
MPV: 10.3 fL (ref 7.5–12.5)
Monocytes Relative: 11.7 %
Neutro Abs: 3724 {cells}/uL (ref 1500–7800)
Neutrophils Relative %: 64.2 %
Platelets: 409 Thousand/uL — ABNORMAL HIGH (ref 140–400)
RBC: 4.48 Million/uL (ref 3.80–5.10)
RDW: 17.2 % — ABNORMAL HIGH (ref 11.0–15.0)
Total Lymphocyte: 21.7 %
WBC: 5.8 Thousand/uL (ref 3.8–10.8)

## 2024-09-29 LAB — IRON,TIBC AND FERRITIN PANEL
%SAT: 5 % — ABNORMAL LOW (ref 16–45)
Ferritin: 9 ng/mL — ABNORMAL LOW (ref 16–288)
Iron: 25 ug/dL — ABNORMAL LOW (ref 45–160)
TIBC: 478 ug/dL — ABNORMAL HIGH (ref 250–450)

## 2024-09-29 LAB — B12 AND FOLATE PANEL
Folate: 19.9 ng/mL
Vitamin B-12: 311 pg/mL (ref 200–1100)

## 2024-10-08 ENCOUNTER — Ambulatory Visit

## 2024-10-08 VITALS — HR 103 | Ht 67.0 in | Wt 231.2 lb

## 2024-10-08 DIAGNOSIS — I10 Essential (primary) hypertension: Secondary | ICD-10-CM | POA: Diagnosis not present

## 2024-10-08 DIAGNOSIS — E611 Iron deficiency: Secondary | ICD-10-CM | POA: Diagnosis not present

## 2024-10-08 DIAGNOSIS — E119 Type 2 diabetes mellitus without complications: Secondary | ICD-10-CM | POA: Diagnosis not present

## 2024-10-08 NOTE — Progress Notes (Signed)
 Progress Note  Physician: Fallon Howerter A Rolonda Pontarelli, MD   HPI: Cynthia Friedman is a 83 y.o. female presenting on 10/08/2024 for Follow-up .  Discussed the use of AI scribe software for clinical note transcription with the patient, who gave verbal consent to proceed.  History of Present Illness   Cynthia Friedman is an 83 year old female who presents with a persistent cough and concerns about medication side effects.  Cough - Persistent cough since Thanksgiving. - Completed Z-Pak with significant improvement. - Avoided cough syrup due to concerns about blurred vision, given history of glaucoma. - Used Tessalon Perles from previous prescription (10-12 doses) without relief. - Cough has improved but persists intermittently. - No fever, palpitations, or shortness of breath.  Iron deficiency and anemia - History of low iron levels. - Currently taking multivitamin with iron and B12. - Hemoglobin currently 10.7 (previously 9.9). - Total iron 25, percent saturation 5, ferritin 9. - Difficulty with adherence to iron tablets due to gastrointestinal side effects and occasional forgetfulness.      Medical history:  Relevant past medical, surgical, family and social history reviewed and updated as indicated. Interim medical history since our last visit reviewed.  Allergies and medications reviewed and updated.   ROS: Negative unless specifically indicated above in HPI.    Current Outpatient Medications:    apixaban  (ELIQUIS ) 5 MG TABS tablet, Take 1 tablet (5 mg total) by mouth 2 (two) times daily., Disp: 60 tablet, Rfl: 3   diltiazem  (CARDIZEM  CD) 240 MG 24 hr capsule, Take 1 capsule (240 mg total) by mouth daily., Disp: 30 capsule, Rfl: 2   fenofibrate  (TRICOR ) 145 MG tablet, Take 145 mg by mouth at bedtime., Disp: , Rfl:    glipiZIDE  (GLUCOTROL ) 10 MG tablet, Take 1 tablet (10 mg total) by mouth daily before breakfast., Disp: 90 tablet, Rfl: 0   JARDIANCE  25 MG TABS  tablet, Take 1 tablet (25 mg total) by mouth daily., Disp: 90 tablet, Rfl: 3   latanoprost  (XALATAN ) 0.005 % ophthalmic solution, Place 1 drop into both eyes at bedtime., Disp: , Rfl:    levothyroxine  (SYNTHROID , LEVOTHROID) 75 MCG tablet, Take 75 mcg by mouth daily before breakfast., Disp: , Rfl:    metFORMIN  (GLUCOPHAGE ) 1000 MG tablet, Take 1,000 mg by mouth 2 (two) times daily., Disp: , Rfl: 5   metoprolol  (LOPRESSOR ) 50 MG tablet, Take 1 tablet (50 mg total) by mouth 2 (two) times daily., Disp: 60 tablet, Rfl: 2   pioglitazone  (ACTOS ) 30 MG tablet, TAKE ONE (1) TABLET EACH DAY, Disp: , Rfl: 5   timolol  (TIMOPTIC -XR) 0.25 % ophthalmic gel-forming, 1 drop once daily., Disp: , Rfl:    cyanocobalamin  (,VITAMIN B-12,) 1000 MCG/ML injection, Inject 1mL (1,000mcg) IM daily for 1 week, then once weekly for 6 weeks, then once monthly for 3 to 6 months (Patient not taking: Reported on 09/24/2024), Disp: 10 mL, Rfl: 2       Objective:     Pulse (!) 103   Ht 5' 7 (1.702 m)   Wt 231 lb 3.2 oz (104.9 kg)   SpO2 93%   BMI 36.21 kg/m   Wt Readings from Last 3 Encounters:  10/08/24 231 lb 3.2 oz (104.9 kg)  09/24/24 227 lb 4 oz (103.1 kg)  08/18/24 232 lb 9.6 oz (105.5 kg)    Physical Exam  Physical Exam Vitals reviewed.  Constitutional:      Appearance: Normal appearance.  Well-developed with normal weight.  Cardiovascular:     Rate and Rhythm: Normal rate and regular rhythm. Normal heart sounds. Normal peripheral pulses Pulmonary:     Normal breath sounds with normal effort Skin:    General: Skin is warm and dry without noticeable rash. Neurological:     General: No focal deficit present.  Psychiatric:        Mood and Affect: Mood, behavior and cognition normal      Assessment & Plan:   Encounter Diagnoses  Name Primary?   Hypertension, essential Yes   Iron deficiency     No orders of the defined types were placed in this encounter.    Assessment and Plan  Iron  deficiency - Discuss alternative iron supplements with pharmacist.  - Recheck in Feb with CBC and DM labs  Acute upper respiratory infection, resolved Recent infection resolved. Persistent cough likely post-infectious irritation. Completed Z-Pak. Cough syrup not taken. - Monitor for recurrence of symptoms.  General health maintenance Routine health maintenance discussed, including blood pressure monitoring and lab follow-up. - Scheduled follow-up appointment in February with lab work prior. - Continue routine health maintenance and monitoring of chronic conditions.

## 2024-10-14 DIAGNOSIS — I5020 Unspecified systolic (congestive) heart failure: Secondary | ICD-10-CM | POA: Diagnosis not present

## 2024-10-14 DIAGNOSIS — I4892 Unspecified atrial flutter: Secondary | ICD-10-CM | POA: Diagnosis not present

## 2024-10-14 DIAGNOSIS — I10 Essential (primary) hypertension: Secondary | ICD-10-CM | POA: Diagnosis not present

## 2024-11-07 ENCOUNTER — Other Ambulatory Visit: Payer: Self-pay

## 2024-11-12 NOTE — Telephone Encounter (Signed)
 Pt is requesting a refill for metformin . Please advise.

## 2024-11-26 ENCOUNTER — Other Ambulatory Visit: Payer: Self-pay

## 2024-11-26 DIAGNOSIS — E119 Type 2 diabetes mellitus without complications: Secondary | ICD-10-CM

## 2024-11-26 NOTE — Telephone Encounter (Signed)
Pt is following up on this refill request. Please advise

## 2024-12-15 ENCOUNTER — Other Ambulatory Visit

## 2024-12-22 ENCOUNTER — Ambulatory Visit
